# Patient Record
Sex: Male | Born: 1965 | Race: White | Hispanic: Yes | Marital: Single | Smoking: Current every day smoker
Health system: Southern US, Community
[De-identification: ages and names within clinical notes are randomized; demographics above are authoritative.]

## PROBLEM LIST (undated history)

## (undated) DIAGNOSIS — K7460 Unspecified cirrhosis of liver: Secondary | ICD-10-CM

## (undated) DIAGNOSIS — R569 Unspecified convulsions: Secondary | ICD-10-CM

---

## 1998-02-13 ENCOUNTER — Emergency Department (HOSPITAL_COMMUNITY): Admission: EM | Admit: 1998-02-13 | Discharge: 1998-02-13 | Payer: Self-pay | Admitting: Emergency Medicine

## 1998-07-21 ENCOUNTER — Encounter: Payer: Self-pay | Admitting: Emergency Medicine

## 1998-07-22 ENCOUNTER — Encounter: Payer: Self-pay | Admitting: Emergency Medicine

## 1998-07-22 ENCOUNTER — Emergency Department (HOSPITAL_COMMUNITY): Admission: EM | Admit: 1998-07-22 | Discharge: 1998-07-22 | Payer: Self-pay | Admitting: Emergency Medicine

## 1998-09-06 ENCOUNTER — Emergency Department (HOSPITAL_COMMUNITY): Admission: EM | Admit: 1998-09-06 | Discharge: 1998-09-06 | Payer: Self-pay | Admitting: Emergency Medicine

## 1998-09-06 ENCOUNTER — Encounter: Payer: Self-pay | Admitting: Emergency Medicine

## 1999-02-24 ENCOUNTER — Emergency Department (HOSPITAL_COMMUNITY): Admission: EM | Admit: 1999-02-24 | Discharge: 1999-02-24 | Payer: Self-pay | Admitting: *Deleted

## 1999-02-25 ENCOUNTER — Encounter: Payer: Self-pay | Admitting: Emergency Medicine

## 1999-04-13 ENCOUNTER — Emergency Department (HOSPITAL_COMMUNITY): Admission: EM | Admit: 1999-04-13 | Discharge: 1999-04-13 | Payer: Self-pay | Admitting: Emergency Medicine

## 1999-04-13 ENCOUNTER — Encounter: Payer: Self-pay | Admitting: Emergency Medicine

## 1999-06-03 ENCOUNTER — Encounter: Payer: Self-pay | Admitting: Emergency Medicine

## 1999-06-03 ENCOUNTER — Emergency Department (HOSPITAL_COMMUNITY): Admission: EM | Admit: 1999-06-03 | Discharge: 1999-06-04 | Payer: Self-pay | Admitting: Emergency Medicine

## 1999-11-03 ENCOUNTER — Encounter: Payer: Self-pay | Admitting: Emergency Medicine

## 1999-11-03 ENCOUNTER — Emergency Department (HOSPITAL_COMMUNITY): Admission: EM | Admit: 1999-11-03 | Discharge: 1999-11-03 | Payer: Self-pay | Admitting: Emergency Medicine

## 2000-07-30 ENCOUNTER — Emergency Department (HOSPITAL_COMMUNITY): Admission: EM | Admit: 2000-07-30 | Discharge: 2000-07-30 | Payer: Self-pay

## 2000-07-30 ENCOUNTER — Encounter: Payer: Self-pay | Admitting: Emergency Medicine

## 2001-02-20 ENCOUNTER — Emergency Department (HOSPITAL_COMMUNITY): Admission: EM | Admit: 2001-02-20 | Discharge: 2001-02-20 | Payer: Self-pay

## 2001-02-25 ENCOUNTER — Emergency Department (HOSPITAL_COMMUNITY): Admission: EM | Admit: 2001-02-25 | Discharge: 2001-02-25 | Payer: Self-pay | Admitting: Emergency Medicine

## 2001-05-11 ENCOUNTER — Emergency Department (HOSPITAL_COMMUNITY): Admission: EM | Admit: 2001-05-11 | Discharge: 2001-05-12 | Payer: Self-pay | Admitting: Emergency Medicine

## 2001-05-11 ENCOUNTER — Encounter: Payer: Self-pay | Admitting: Emergency Medicine

## 2001-07-09 ENCOUNTER — Encounter: Payer: Self-pay | Admitting: Emergency Medicine

## 2001-07-09 ENCOUNTER — Inpatient Hospital Stay (HOSPITAL_COMMUNITY): Admission: AC | Admit: 2001-07-09 | Discharge: 2001-07-12 | Payer: Self-pay

## 2001-07-11 ENCOUNTER — Encounter: Payer: Self-pay | Admitting: General Surgery

## 2001-07-13 ENCOUNTER — Encounter: Payer: Self-pay | Admitting: Emergency Medicine

## 2001-07-13 ENCOUNTER — Emergency Department (HOSPITAL_COMMUNITY): Admission: EM | Admit: 2001-07-13 | Discharge: 2001-07-14 | Payer: Self-pay | Admitting: Emergency Medicine

## 2001-09-17 ENCOUNTER — Emergency Department (HOSPITAL_COMMUNITY): Admission: EM | Admit: 2001-09-17 | Discharge: 2001-09-17 | Payer: Self-pay

## 2002-05-19 ENCOUNTER — Emergency Department (HOSPITAL_COMMUNITY): Admission: EM | Admit: 2002-05-19 | Discharge: 2002-05-20 | Payer: Self-pay | Admitting: Emergency Medicine

## 2002-05-19 ENCOUNTER — Encounter: Payer: Self-pay | Admitting: Emergency Medicine

## 2002-05-22 ENCOUNTER — Encounter: Payer: Self-pay | Admitting: Emergency Medicine

## 2002-05-22 ENCOUNTER — Emergency Department (HOSPITAL_COMMUNITY): Admission: EM | Admit: 2002-05-22 | Discharge: 2002-05-22 | Payer: Self-pay | Admitting: Emergency Medicine

## 2002-06-04 ENCOUNTER — Emergency Department (HOSPITAL_COMMUNITY): Admission: EM | Admit: 2002-06-04 | Discharge: 2002-06-04 | Payer: Self-pay | Admitting: Emergency Medicine

## 2002-09-07 ENCOUNTER — Emergency Department (HOSPITAL_COMMUNITY): Admission: EM | Admit: 2002-09-07 | Discharge: 2002-09-07 | Payer: Self-pay | Admitting: Emergency Medicine

## 2003-03-10 ENCOUNTER — Emergency Department (HOSPITAL_COMMUNITY): Admission: EM | Admit: 2003-03-10 | Discharge: 2003-03-10 | Payer: Self-pay | Admitting: Emergency Medicine

## 2003-03-10 ENCOUNTER — Encounter: Payer: Self-pay | Admitting: Emergency Medicine

## 2003-04-04 ENCOUNTER — Emergency Department (HOSPITAL_COMMUNITY): Admission: EM | Admit: 2003-04-04 | Discharge: 2003-04-05 | Payer: Self-pay | Admitting: *Deleted

## 2003-04-05 ENCOUNTER — Encounter: Payer: Self-pay | Admitting: Emergency Medicine

## 2003-04-05 ENCOUNTER — Encounter: Payer: Self-pay | Admitting: *Deleted

## 2003-04-09 ENCOUNTER — Encounter: Payer: Self-pay | Admitting: Emergency Medicine

## 2003-04-09 ENCOUNTER — Emergency Department (HOSPITAL_COMMUNITY): Admission: EM | Admit: 2003-04-09 | Discharge: 2003-04-09 | Payer: Self-pay | Admitting: Emergency Medicine

## 2003-11-16 ENCOUNTER — Inpatient Hospital Stay (HOSPITAL_COMMUNITY): Admission: EM | Admit: 2003-11-16 | Discharge: 2003-11-18 | Payer: Self-pay | Admitting: *Deleted

## 2003-12-02 ENCOUNTER — Emergency Department (HOSPITAL_COMMUNITY): Admission: EM | Admit: 2003-12-02 | Discharge: 2003-12-02 | Payer: Self-pay | Admitting: Emergency Medicine

## 2003-12-06 ENCOUNTER — Emergency Department (HOSPITAL_COMMUNITY): Admission: EM | Admit: 2003-12-06 | Discharge: 2003-12-06 | Payer: Self-pay | Admitting: Emergency Medicine

## 2003-12-22 ENCOUNTER — Emergency Department (HOSPITAL_COMMUNITY): Admission: AC | Admit: 2003-12-22 | Discharge: 2003-12-23 | Payer: Self-pay

## 2004-02-03 ENCOUNTER — Emergency Department (HOSPITAL_COMMUNITY): Admission: EM | Admit: 2004-02-03 | Discharge: 2004-02-03 | Payer: Self-pay | Admitting: Emergency Medicine

## 2004-06-22 ENCOUNTER — Emergency Department (HOSPITAL_COMMUNITY): Admission: EM | Admit: 2004-06-22 | Discharge: 2004-06-22 | Payer: Self-pay | Admitting: Emergency Medicine

## 2004-09-27 ENCOUNTER — Emergency Department (HOSPITAL_COMMUNITY): Admission: EM | Admit: 2004-09-27 | Discharge: 2004-09-27 | Payer: Self-pay | Admitting: Emergency Medicine

## 2004-11-30 ENCOUNTER — Emergency Department (HOSPITAL_COMMUNITY): Admission: EM | Admit: 2004-11-30 | Discharge: 2004-11-30 | Payer: Self-pay | Admitting: Emergency Medicine

## 2005-03-12 ENCOUNTER — Emergency Department (HOSPITAL_COMMUNITY): Admission: EM | Admit: 2005-03-12 | Discharge: 2005-03-12 | Payer: Self-pay | Admitting: Emergency Medicine

## 2005-03-13 ENCOUNTER — Ambulatory Visit: Payer: Self-pay | Admitting: *Deleted

## 2005-05-15 ENCOUNTER — Emergency Department (HOSPITAL_COMMUNITY): Admission: EM | Admit: 2005-05-15 | Discharge: 2005-05-15 | Payer: Self-pay | Admitting: Emergency Medicine

## 2005-06-28 ENCOUNTER — Inpatient Hospital Stay (HOSPITAL_COMMUNITY): Admission: EM | Admit: 2005-06-28 | Discharge: 2005-06-29 | Payer: Self-pay | Admitting: Emergency Medicine

## 2005-06-28 ENCOUNTER — Emergency Department (HOSPITAL_COMMUNITY): Admission: EM | Admit: 2005-06-28 | Discharge: 2005-06-28 | Payer: Self-pay | Admitting: Emergency Medicine

## 2005-08-25 ENCOUNTER — Emergency Department (HOSPITAL_COMMUNITY): Admission: AC | Admit: 2005-08-25 | Discharge: 2005-08-26 | Payer: Self-pay

## 2005-12-06 ENCOUNTER — Emergency Department (HOSPITAL_COMMUNITY): Admission: EM | Admit: 2005-12-06 | Discharge: 2005-12-06 | Payer: Self-pay | Admitting: Emergency Medicine

## 2006-02-21 ENCOUNTER — Emergency Department (HOSPITAL_COMMUNITY): Admission: EM | Admit: 2006-02-21 | Discharge: 2006-02-22 | Payer: Self-pay | Admitting: Emergency Medicine

## 2006-03-30 ENCOUNTER — Emergency Department (HOSPITAL_COMMUNITY): Admission: AC | Admit: 2006-03-30 | Discharge: 2006-03-30 | Payer: Self-pay

## 2006-05-24 ENCOUNTER — Inpatient Hospital Stay (HOSPITAL_COMMUNITY): Admission: AC | Admit: 2006-05-24 | Discharge: 2006-06-01 | Payer: Self-pay

## 2007-10-06 ENCOUNTER — Emergency Department (HOSPITAL_COMMUNITY): Admission: EM | Admit: 2007-10-06 | Discharge: 2007-10-07 | Payer: Self-pay | Admitting: Emergency Medicine

## 2007-11-03 ENCOUNTER — Emergency Department (HOSPITAL_COMMUNITY): Admission: EM | Admit: 2007-11-03 | Discharge: 2007-11-03 | Payer: Self-pay | Admitting: Emergency Medicine

## 2007-11-16 ENCOUNTER — Emergency Department (HOSPITAL_COMMUNITY): Admission: EM | Admit: 2007-11-16 | Discharge: 2007-11-16 | Payer: Self-pay | Admitting: Emergency Medicine

## 2007-12-08 ENCOUNTER — Emergency Department (HOSPITAL_COMMUNITY): Admission: EM | Admit: 2007-12-08 | Discharge: 2007-12-09 | Payer: Self-pay

## 2008-01-06 ENCOUNTER — Emergency Department (HOSPITAL_COMMUNITY): Admission: EM | Admit: 2008-01-06 | Discharge: 2008-01-07 | Payer: Self-pay | Admitting: Emergency Medicine

## 2008-03-02 ENCOUNTER — Emergency Department (HOSPITAL_COMMUNITY): Admission: EM | Admit: 2008-03-02 | Discharge: 2008-03-03 | Payer: Self-pay | Admitting: Emergency Medicine

## 2008-11-20 ENCOUNTER — Emergency Department (HOSPITAL_COMMUNITY): Admission: EM | Admit: 2008-11-20 | Discharge: 2008-11-21 | Payer: Self-pay | Admitting: Emergency Medicine

## 2010-04-22 ENCOUNTER — Emergency Department (HOSPITAL_COMMUNITY): Admission: EM | Admit: 2010-04-22 | Discharge: 2010-04-23 | Payer: Self-pay | Admitting: Emergency Medicine

## 2010-09-11 LAB — BASIC METABOLIC PANEL
Calcium: 8.3 mg/dL — ABNORMAL LOW (ref 8.4–10.5)
Chloride: 103 mEq/L (ref 96–112)
GFR calc Af Amer: >60 mL/min (ref >60)
GFR calc non Af Amer: >60 mL/min (ref >60)
Potassium: 3.9 mEq/L (ref 3.5–5.1)
Sodium: 138 mEq/L (ref 135–145)

## 2010-09-11 LAB — DIFFERENTIAL
Basophils Relative: 1 % (ref 0–1)
Lymphocytes Relative: 33 % (ref 12–46)
Monocytes Absolute: 0.3 10*3/uL (ref 0.1–1.0)
Monocytes Relative: 4 % (ref 3–12)
Neutro Abs: 4 10*3/uL (ref 1.7–7.7)
Neutrophils Relative %: 62 % (ref 43–77)

## 2010-09-11 LAB — ETHANOL: Alcohol, Ethyl (B): 353 mg/dL — ABNORMAL HIGH (ref 0–10)

## 2010-09-11 LAB — CBC: MCV: 90 fL (ref 78.0–100.0)

## 2010-10-08 LAB — DIFFERENTIAL
Lymphocytes Relative: 27 % (ref 12–46)
Lymphs Abs: 2 10*3/uL (ref 0.7–4.0)
Neutrophils Relative %: 66 % (ref 43–77)

## 2010-10-08 LAB — URINALYSIS, ROUTINE W REFLEX MICROSCOPIC
Bilirubin Urine: NEGATIVE
Glucose, UA: NEGATIVE mg/dL
Ketones, ur: NEGATIVE mg/dL
Nitrite: NEGATIVE
Protein, ur: NEGATIVE mg/dL
Specific Gravity, Urine: 1.005 (ref 1.005–1.030)

## 2010-10-08 LAB — CBC
Hemoglobin: 14.6 g/dL (ref 13.0–17.0)
MCHC: 33.6 g/dL (ref 30.0–36.0)
MCV: 92.3 fL (ref 78.0–100.0)
RBC: 4.72 MIL/uL (ref 4.22–5.81)
RDW: 14.2 % (ref 11.5–15.5)

## 2010-10-08 LAB — COMPREHENSIVE METABOLIC PANEL
CO2: 30 mEq/L (ref 19–32)
Calcium: 8.7 mg/dL (ref 8.4–10.5)
Creatinine, Ser: 0.65 mg/dL (ref 0.4–1.5)
GFR calc Af Amer: >60 mL/min (ref >60)
GFR calc non Af Amer: >60 mL/min (ref >60)
Glucose, Bld: 107 mg/dL — ABNORMAL HIGH (ref 70–99)
Total Protein: 7.3 g/dL (ref 6.0–8.3)

## 2010-10-08 LAB — RAPID URINE DRUG SCREEN, HOSP PERFORMED
Barbiturates: NOT DETECTED
Tetrahydrocannabinol: NOT DETECTED

## 2010-10-09 ENCOUNTER — Emergency Department (HOSPITAL_COMMUNITY)
Admission: EM | Admit: 2010-10-09 | Discharge: 2010-10-10 | Disposition: A | Discharge status: Home / Self Care | Payer: Self-pay | Attending: Emergency Medicine | Admitting: Emergency Medicine

## 2010-10-09 DIAGNOSIS — R1011 Right upper quadrant pain: Secondary | ICD-10-CM | POA: Insufficient documentation

## 2010-10-09 DIAGNOSIS — F1010 Alcohol abuse, uncomplicated: Secondary | ICD-10-CM | POA: Insufficient documentation

## 2010-10-10 LAB — CBC
HCT: 41.5 % (ref 39.0–52.0)
Hemoglobin: 14.7 g/dL (ref 13.0–17.0)
MCH: 33 pg (ref 26.0–34.0)
MCV: 93 fL (ref 78.0–100.0)
RBC: 4.46 MIL/uL (ref 4.22–5.81)

## 2010-10-10 LAB — COMPREHENSIVE METABOLIC PANEL
Albumin: 4 g/dL (ref 3.5–5.2)
Alkaline Phosphatase: 92 U/L (ref 39–117)
BUN: 10 mg/dL (ref 6–23)
Chloride: 105 mEq/L (ref 96–112)
Glucose, Bld: 98 mg/dL (ref 70–99)
Potassium: 3.8 mEq/L (ref 3.5–5.1)
Total Bilirubin: 0.7 mg/dL (ref 0.3–1.2)

## 2010-10-10 LAB — ETHANOL: Alcohol, Ethyl (B): 288 mg/dL — ABNORMAL HIGH (ref 0–10)

## 2010-10-10 LAB — DIFFERENTIAL
Lymphocytes Relative: 43 % (ref 12–46)
Lymphs Abs: 2.2 10*3/uL (ref 0.7–4.0)
Monocytes Relative: 6 % (ref 3–12)
Neutro Abs: 2.1 10*3/uL (ref 1.7–7.7)
Neutrophils Relative %: 41 % — ABNORMAL LOW (ref 43–77)

## 2010-10-22 ENCOUNTER — Emergency Department (HOSPITAL_COMMUNITY)
Admission: EM | Admit: 2010-10-22 | Discharge: 2010-10-22 | Disposition: A | Discharge status: Home / Self Care | Payer: Self-pay | Attending: Emergency Medicine | Admitting: Emergency Medicine

## 2010-10-22 DIAGNOSIS — F1010 Alcohol abuse, uncomplicated: Secondary | ICD-10-CM | POA: Insufficient documentation

## 2010-10-22 LAB — BASIC METABOLIC PANEL
BUN: 8 mg/dL (ref 6–23)
CO2: 24 mEq/L (ref 19–32)
Chloride: 109 mEq/L (ref 96–112)
Creatinine, Ser: 0.62 mg/dL (ref 0.4–1.5)

## 2010-10-22 LAB — ETHANOL: Alcohol, Ethyl (B): 409 mg/dL (ref 0–10)
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
## 2011-03-25 LAB — URINALYSIS, ROUTINE W REFLEX MICROSCOPIC
Bilirubin Urine: NEGATIVE
Glucose, UA: NEGATIVE
Hgb urine dipstick: NEGATIVE
Ketones, ur: NEGATIVE
Protein, ur: NEGATIVE

## 2011-03-25 LAB — POCT I-STAT, CHEM 8
BUN: 9
Calcium, Ion: 1.11 — ABNORMAL LOW
Chloride: 108
Creatinine, Ser: 0.9

## 2011-03-25 LAB — CBC
HCT: 39.3
Hemoglobin: 13.9
MCV: 91.6
Platelets: 214
RBC: 4.29
WBC: 5

## 2011-03-25 LAB — DIFFERENTIAL
Eosinophils Relative: 1
Lymphocytes Relative: 41
Lymphs Abs: 2
Monocytes Absolute: 0.3
Monocytes Relative: 5

## 2011-03-25 LAB — SALICYLATE LEVEL: Salicylate Lvl: <4

## 2011-03-25 LAB — RAPID URINE DRUG SCREEN, HOSP PERFORMED
Amphetamines: NOT DETECTED
Benzodiazepines: NOT DETECTED
Tetrahydrocannabinol: NOT DETECTED

## 2011-03-25 LAB — ETHANOL
Alcohol, Ethyl (B): 130 — ABNORMAL HIGH
Alcohol, Ethyl (B): 356 — ABNORMAL HIGH

## 2011-03-27 LAB — COMPREHENSIVE METABOLIC PANEL
ALT: 26
Alkaline Phosphatase: 78
CO2: 20
Glucose, Bld: 100 — ABNORMAL HIGH
Potassium: 3.7
Sodium: 139

## 2011-03-27 LAB — CBC
HCT: 38.6 — ABNORMAL LOW
HCT: 38.8 — ABNORMAL LOW
Hemoglobin: 13.1
Hemoglobin: 13.1
MCHC: 33.8
MCHC: 33.9
Platelets: 232
RBC: 4.12 — ABNORMAL LOW
RDW: 14.5

## 2011-03-27 LAB — DIFFERENTIAL
Basophils Absolute: 0.1
Basophils Relative: 1
Eosinophils Absolute: 0.1
Eosinophils Absolute: 0.1
Lymphocytes Relative: 32
Monocytes Relative: 7
Neutro Abs: 3.1
Neutrophils Relative %: 58

## 2011-03-27 LAB — BASIC METABOLIC PANEL
BUN: 5 — ABNORMAL LOW
CO2: 24
Calcium: 8.6
GFR calc non Af Amer: >60
Glucose, Bld: 96

## 2011-03-27 LAB — ETHANOL: Alcohol, Ethyl (B): 85 — ABNORMAL HIGH

## 2011-04-02 LAB — ETHANOL: Alcohol, Ethyl (B): 466

## 2012-02-16 ENCOUNTER — Encounter (HOSPITAL_COMMUNITY): Payer: Self-pay

## 2012-02-16 ENCOUNTER — Emergency Department (HOSPITAL_COMMUNITY): Payer: Self-pay

## 2012-02-16 ENCOUNTER — Emergency Department (HOSPITAL_COMMUNITY)
Admission: EM | Admit: 2012-02-16 | Discharge: 2012-02-16 | Disposition: A | Discharge status: Home / Self Care | Payer: Self-pay | Attending: Emergency Medicine | Admitting: Emergency Medicine

## 2012-02-16 DIAGNOSIS — S20219A Contusion of unspecified front wall of thorax, initial encounter: Secondary | ICD-10-CM | POA: Insufficient documentation

## 2012-02-16 DIAGNOSIS — IMO0002 Reserved for concepts with insufficient information to code with codable children: Secondary | ICD-10-CM

## 2012-02-16 DIAGNOSIS — F17200 Nicotine dependence, unspecified, uncomplicated: Secondary | ICD-10-CM | POA: Insufficient documentation

## 2012-02-16 DIAGNOSIS — X58XXXA Exposure to other specified factors, initial encounter: Secondary | ICD-10-CM | POA: Insufficient documentation

## 2012-02-16 DIAGNOSIS — F1010 Alcohol abuse, uncomplicated: Secondary | ICD-10-CM | POA: Insufficient documentation

## 2012-02-16 DIAGNOSIS — R4182 Altered mental status, unspecified: Secondary | ICD-10-CM | POA: Insufficient documentation

## 2012-02-16 HISTORY — DX: Unspecified convulsions: R56.9

## 2012-02-16 LAB — CBC
HCT: 43.1 % (ref 39.0–52.0)
Hemoglobin: 15.1 g/dL (ref 13.0–17.0)
WBC: 6.5 10*3/uL (ref 4.0–10.5)

## 2012-02-16 LAB — POCT I-STAT, CHEM 8
Chloride: 105 mEq/L (ref 96–112)
Glucose, Bld: 105 mg/dL — ABNORMAL HIGH (ref 70–99)
HCT: 46 % (ref 39.0–52.0)
Potassium: 3.7 mEq/L (ref 3.5–5.1)

## 2012-02-16 LAB — ETHANOL: Alcohol, Ethyl (B): 376 mg/dL — ABNORMAL HIGH (ref 0–11)
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
## 2012-02-16 NOTE — ED Provider Notes (Signed)
Assumed care of pt from Dr Fredderick Phenix. Clinically sober at this time. Will be provided with bus pass. Pt has point tenderness L chest wall consistent with musculoskeletal etiology. Doubt cardiac, infectious, PE, etc. Return precautions discussed. Resource list provided.   Travis Razor, MD 02/16/12 [phone]

## 2012-02-16 NOTE — ED Notes (Signed)
MD at bedside. 
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
## 2012-08-19 ENCOUNTER — Emergency Department (HOSPITAL_COMMUNITY)
Admission: EM | Admit: 2012-08-19 | Discharge: 2012-08-20 | Disposition: A | Discharge status: Home / Self Care | Payer: Self-pay | Attending: Emergency Medicine | Admitting: Emergency Medicine

## 2012-08-19 ENCOUNTER — Encounter (HOSPITAL_COMMUNITY): Payer: Self-pay | Admitting: Emergency Medicine

## 2012-08-19 ENCOUNTER — Emergency Department (HOSPITAL_COMMUNITY): Payer: Self-pay

## 2012-08-19 DIAGNOSIS — K760 Fatty (change of) liver, not elsewhere classified: Secondary | ICD-10-CM

## 2012-08-19 DIAGNOSIS — Z8669 Personal history of other diseases of the nervous system and sense organs: Secondary | ICD-10-CM | POA: Insufficient documentation

## 2012-08-19 DIAGNOSIS — F10929 Alcohol use, unspecified with intoxication, unspecified: Secondary | ICD-10-CM

## 2012-08-19 DIAGNOSIS — F17200 Nicotine dependence, unspecified, uncomplicated: Secondary | ICD-10-CM | POA: Insufficient documentation

## 2012-08-19 DIAGNOSIS — E162 Hypoglycemia, unspecified: Secondary | ICD-10-CM | POA: Insufficient documentation

## 2012-08-19 DIAGNOSIS — F1010 Alcohol abuse, uncomplicated: Secondary | ICD-10-CM | POA: Insufficient documentation

## 2012-08-19 DIAGNOSIS — E7889 Other lipoprotein metabolism disorders: Secondary | ICD-10-CM | POA: Insufficient documentation

## 2012-08-19 LAB — COMPREHENSIVE METABOLIC PANEL
Alkaline Phosphatase: 120 U/L — ABNORMAL HIGH (ref 39–117)
BUN: 8 mg/dL (ref 6–23)
Calcium: 8.1 mg/dL — ABNORMAL LOW (ref 8.4–10.5)
GFR calc Af Amer: >90 mL/min (ref >90)
GFR calc non Af Amer: >90 mL/min (ref >90)
Glucose, Bld: 91 mg/dL (ref 70–99)
Total Protein: 7 g/dL (ref 6.0–8.3)

## 2012-08-19 LAB — URINALYSIS, ROUTINE W REFLEX MICROSCOPIC
Glucose, UA: 250 mg/dL — AB
Hgb urine dipstick: NEGATIVE
Protein, ur: NEGATIVE mg/dL
pH: 5 (ref 5.0–8.0)

## 2012-08-19 LAB — RAPID URINE DRUG SCREEN, HOSP PERFORMED
Amphetamines: NOT DETECTED
Benzodiazepines: NOT DETECTED
Opiates: NOT DETECTED

## 2012-08-19 LAB — CBC WITH DIFFERENTIAL/PLATELET
Eosinophils Absolute: 0.1 10*3/uL (ref 0.0–0.7)
Eosinophils Relative: 1 % (ref 0–5)
HCT: 38.7 % — ABNORMAL LOW (ref 39.0–52.0)
Hemoglobin: 13.5 g/dL (ref 13.0–17.0)
Lymphs Abs: 2.6 10*3/uL (ref 0.7–4.0)
MCH: 31.4 pg (ref 26.0–34.0)
MCV: 90 fL (ref 78.0–100.0)
Monocytes Relative: 7 % (ref 3–12)
RBC: 4.3 MIL/uL (ref 4.22–5.81)

## 2012-08-19 LAB — ETHANOL: Alcohol, Ethyl (B): 281 mg/dL — ABNORMAL HIGH (ref 0–11)

## 2012-08-19 LAB — GLUCOSE, CAPILLARY: Glucose-Capillary: 104 mg/dL — ABNORMAL HIGH (ref 70–99)

## 2012-08-19 MED ORDER — SODIUM CHLORIDE 0.9 % IV SOLN
INTRAVENOUS | Status: DC
  Administered 2012-08-20: via INTRAVENOUS
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
## 2012-08-19 NOTE — ED Notes (Signed)
PA at bedside.
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
## 2012-08-20 ENCOUNTER — Emergency Department (HOSPITAL_COMMUNITY): Payer: Self-pay

## 2012-08-20 LAB — GLUCOSE, CAPILLARY: Glucose-Capillary: 88 mg/dL (ref 70–99)

## 2012-08-20 NOTE — ED Notes (Signed)
Pt ambulated down hallway without incident.  Pt used bathroom without incident.  Pt denies any dizziness.

## 2012-08-20 NOTE — ED Notes (Signed)
Pt denies SI/HI, but placed in blue scrubs for psychiatric evaluation. Pt has been friendly with staff often asking for "sugar" and complimenting staff members. Pt has not been combative. Pt kept attempting to take off second IV, but was compliant once explained purpose of IV.

## 2012-08-20 NOTE — ED Notes (Signed)
Pt transported to US

## 2012-08-20 NOTE — ED Notes (Signed)
Informed us tech pt is ready. Pt is second in line.

## 2012-08-20 NOTE — ED Provider Notes (Signed)
Medical screening examination/treatment/procedure(s) were performed by non-physician practitioner and as supervising physician I was immediately available for consultation/collaboration.  Jones Skene, M.D.     Jones Skene, MD 08/20/12 1050

## 2012-11-24 ENCOUNTER — Emergency Department (HOSPITAL_COMMUNITY)
Admission: EM | Admit: 2012-11-24 | Discharge: 2012-11-25 | Disposition: A | Discharge status: Home / Self Care | Payer: Self-pay | Attending: Emergency Medicine | Admitting: Emergency Medicine

## 2012-11-24 ENCOUNTER — Emergency Department (HOSPITAL_COMMUNITY): Payer: Self-pay

## 2012-11-24 DIAGNOSIS — S0003XA Contusion of scalp, initial encounter: Secondary | ICD-10-CM | POA: Insufficient documentation

## 2012-11-24 DIAGNOSIS — Y9389 Activity, other specified: Secondary | ICD-10-CM | POA: Insufficient documentation

## 2012-11-24 DIAGNOSIS — T07XXXA Unspecified multiple injuries, initial encounter: Secondary | ICD-10-CM

## 2012-11-24 DIAGNOSIS — Z23 Encounter for immunization: Secondary | ICD-10-CM | POA: Insufficient documentation

## 2012-11-24 DIAGNOSIS — IMO0002 Reserved for concepts with insufficient information to code with codable children: Secondary | ICD-10-CM | POA: Insufficient documentation

## 2012-11-24 DIAGNOSIS — Y92410 Unspecified street and highway as the place of occurrence of the external cause: Secondary | ICD-10-CM | POA: Insufficient documentation

## 2012-11-24 DIAGNOSIS — S1093XA Contusion of unspecified part of neck, initial encounter: Secondary | ICD-10-CM | POA: Insufficient documentation

## 2012-11-24 DIAGNOSIS — Z8669 Personal history of other diseases of the nervous system and sense organs: Secondary | ICD-10-CM | POA: Insufficient documentation

## 2012-11-24 LAB — CBC
HCT: 39.4 % (ref 39.0–52.0)
Hemoglobin: 14 g/dL (ref 13.0–17.0)
MCHC: 35.5 g/dL (ref 30.0–36.0)
MCV: 90.4 fL (ref 78.0–100.0)
RDW: 12.8 % (ref 11.5–15.5)
WBC: 5.5 10*3/uL (ref 4.0–10.5)

## 2012-11-24 LAB — POCT I-STAT, CHEM 8
Calcium, Ion: 1.03 mmol/L — ABNORMAL LOW (ref 1.12–1.23)
Glucose, Bld: 104 mg/dL — ABNORMAL HIGH (ref 70–99)
HCT: 41 % (ref 39.0–52.0)
Hemoglobin: 13.9 g/dL (ref 13.0–17.0)
Potassium: 3.4 mEq/L — ABNORMAL LOW (ref 3.5–5.1)
TCO2: 24 mmol/L (ref 0–100)

## 2012-11-24 LAB — COMPREHENSIVE METABOLIC PANEL
ALT: 44 U/L (ref 0–53)
Albumin: 4 g/dL (ref 3.5–5.2)
Alkaline Phosphatase: 119 U/L — ABNORMAL HIGH (ref 39–117)
Calcium: 8.6 mg/dL (ref 8.4–10.5)
Potassium: 3.2 mEq/L — ABNORMAL LOW (ref 3.5–5.1)
Sodium: 140 mEq/L (ref 135–145)
Total Protein: 7.5 g/dL (ref 6.0–8.3)

## 2012-11-24 LAB — SAMPLE TO BLOOD BANK

## 2012-11-24 MED ORDER — TETANUS-DIPHTHERIA TOXOIDS TD 5-2 LFU IM INJ
0.5000 mL | INJECTION | Freq: Once | INTRAMUSCULAR | Status: DC
  Filled 2012-11-24: qty 0.5

## 2012-11-24 NOTE — ED Notes (Signed)
Patient advised we need a urine sample.
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
## 2012-11-25 ENCOUNTER — Emergency Department (HOSPITAL_COMMUNITY): Payer: Self-pay

## 2012-11-25 MED ORDER — IOHEXOL 300 MG/ML  SOLN
100.0000 mL | Freq: Once | INTRAMUSCULAR | Status: AC | PRN
  Administered 2012-11-25: 100 mL via INTRAVENOUS

## 2012-11-25 MED ORDER — NAPROXEN 500 MG PO TABS: 500.0000 mg | ORAL_TABLET | Freq: Two times a day (BID) | ORAL | Status: DC

## 2012-11-25 MED ORDER — TETANUS-DIPHTH-ACELL PERTUSSIS 5-2.5-18.5 LF-MCG/0.5 IM SUSP
0.5000 mL | Freq: Once | INTRAMUSCULAR | Status: AC
  Administered 2012-11-25: 0.5 mL via INTRAMUSCULAR
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
## 2012-11-25 NOTE — ED Notes (Signed)
The patient is AOx4 and comfortable with his discharge instructions. 

## 2012-12-05 ENCOUNTER — Emergency Department (HOSPITAL_COMMUNITY): Payer: Self-pay

## 2012-12-05 ENCOUNTER — Emergency Department (HOSPITAL_COMMUNITY)
Admission: EM | Admit: 2012-12-05 | Discharge: 2012-12-05 | Disposition: A | Discharge status: Home / Self Care | Payer: Self-pay | Attending: Emergency Medicine | Admitting: Emergency Medicine

## 2012-12-05 DIAGNOSIS — F10920 Alcohol use, unspecified with intoxication, uncomplicated: Secondary | ICD-10-CM

## 2012-12-05 DIAGNOSIS — F17200 Nicotine dependence, unspecified, uncomplicated: Secondary | ICD-10-CM | POA: Insufficient documentation

## 2012-12-05 DIAGNOSIS — Z8669 Personal history of other diseases of the nervous system and sense organs: Secondary | ICD-10-CM | POA: Insufficient documentation

## 2012-12-05 DIAGNOSIS — F1010 Alcohol abuse, uncomplicated: Secondary | ICD-10-CM | POA: Insufficient documentation

## 2012-12-05 DIAGNOSIS — S0990XA Unspecified injury of head, initial encounter: Secondary | ICD-10-CM | POA: Insufficient documentation

## 2012-12-05 DIAGNOSIS — S0993XA Unspecified injury of face, initial encounter: Secondary | ICD-10-CM | POA: Insufficient documentation

## 2012-12-05 LAB — CBC
MCH: 32.4 pg (ref 26.0–34.0)
MCHC: 35.7 g/dL (ref 30.0–36.0)
MCV: 90.7 fL (ref 78.0–100.0)
Platelets: 209 10*3/uL (ref 150–400)

## 2012-12-05 LAB — BASIC METABOLIC PANEL
BUN: 8 mg/dL (ref 6–23)
Calcium: 8.3 mg/dL — ABNORMAL LOW (ref 8.4–10.5)
Creatinine, Ser: 0.52 mg/dL (ref 0.50–1.35)
GFR calc Af Amer: >90 mL/min (ref >90)
GFR calc non Af Amer: >90 mL/min (ref >90)
Potassium: 3.5 mEq/L (ref 3.5–5.1)

## 2012-12-05 NOTE — ED Notes (Signed)
Patient up walking.MD Lynelle Doctor in to eval. Walking with a limp stated his leg is hurting.

## 2012-12-05 NOTE — ED Notes (Signed)
Patient requesting food. Stated he will be ready to go after he eats
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
## 2012-12-05 NOTE — ED Notes (Signed)
Bed:WA11<BR> Expected date:<BR> Expected time:<BR> Means of arrival:<BR> Comments:<BR> EMS
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
## 2013-04-22 ENCOUNTER — Encounter (HOSPITAL_COMMUNITY): Payer: Self-pay | Admitting: Certified Registered Nurse Anesthetist

## 2013-04-22 ENCOUNTER — Inpatient Hospital Stay (HOSPITAL_COMMUNITY)
Admission: EM | Admit: 2013-04-22 | Discharge: 2013-04-23 | DRG: 494 | Disposition: A | Discharge status: Home / Self Care | Payer: Self-pay | Attending: Orthopedic Surgery | Admitting: Orthopedic Surgery

## 2013-04-22 ENCOUNTER — Inpatient Hospital Stay (HOSPITAL_COMMUNITY): Payer: No Typology Code available for payment source

## 2013-04-22 ENCOUNTER — Encounter (HOSPITAL_COMMUNITY): Payer: Self-pay | Admitting: Emergency Medicine

## 2013-04-22 ENCOUNTER — Emergency Department (HOSPITAL_COMMUNITY): Payer: Self-pay

## 2013-04-22 ENCOUNTER — Encounter (HOSPITAL_COMMUNITY)
Admission: EM | Disposition: A | Discharge status: Home / Self Care | Payer: Self-pay | Source: Home / Self Care | Attending: Orthopedic Surgery

## 2013-04-22 ENCOUNTER — Inpatient Hospital Stay (HOSPITAL_COMMUNITY): Payer: Self-pay | Admitting: Certified Registered Nurse Anesthetist

## 2013-04-22 DIAGNOSIS — F1010 Alcohol abuse, uncomplicated: Secondary | ICD-10-CM | POA: Diagnosis present

## 2013-04-22 DIAGNOSIS — W172XXA Fall into hole, initial encounter: Secondary | ICD-10-CM | POA: Diagnosis present

## 2013-04-22 DIAGNOSIS — Y929 Unspecified place or not applicable: Secondary | ICD-10-CM

## 2013-04-22 DIAGNOSIS — S8253XA Displaced fracture of medial malleolus of unspecified tibia, initial encounter for closed fracture: Principal | ICD-10-CM | POA: Diagnosis present

## 2013-04-22 DIAGNOSIS — S82853A Displaced trimalleolar fracture of unspecified lower leg, initial encounter for closed fracture: Secondary | ICD-10-CM | POA: Diagnosis present

## 2013-04-22 DIAGNOSIS — S82892A Other fracture of left lower leg, initial encounter for closed fracture: Secondary | ICD-10-CM

## 2013-04-22 DIAGNOSIS — F17200 Nicotine dependence, unspecified, uncomplicated: Secondary | ICD-10-CM | POA: Diagnosis present

## 2013-04-22 DIAGNOSIS — F1210 Cannabis abuse, uncomplicated: Secondary | ICD-10-CM | POA: Diagnosis present

## 2013-04-22 HISTORY — PX: ORIF ANKLE FRACTURE: SHX5408

## 2013-04-22 LAB — CBC
HCT: 42.4 % (ref 39.0–52.0)
Hemoglobin: 15 g/dL (ref 13.0–17.0)
MCH: 31.8 pg (ref 26.0–34.0)
MCHC: 35.4 g/dL (ref 30.0–36.0)
Platelets: 268 10*3/uL (ref 150–400)
RBC: 4.71 MIL/uL (ref 4.22–5.81)

## 2013-04-22 LAB — SURGICAL PCR SCREEN: Staphylococcus aureus: NEGATIVE

## 2013-04-22 LAB — BASIC METABOLIC PANEL
BUN: 8 mg/dL (ref 6–23)
CO2: 25 mEq/L (ref 19–32)
Chloride: 104 mEq/L (ref 96–112)
GFR calc Af Amer: >90 mL/min (ref >90)
GFR calc non Af Amer: >90 mL/min (ref >90)
Glucose, Bld: 103 mg/dL — ABNORMAL HIGH (ref 70–99)
Potassium: 3.7 mEq/L (ref 3.5–5.1)

## 2013-04-22 SURGERY — OPEN REDUCTION INTERNAL FIXATION (ORIF) ANKLE FRACTURE
Anesthesia: General | Site: Ankle | Laterality: Left | Wound class: Clean

## 2013-04-22 MED ORDER — SODIUM CHLORIDE 0.9 % IV BOLUS (SEPSIS)
1000.0000 mL | Freq: Once | INTRAVENOUS | Status: AC
  Administered 2013-04-22: 1000 mL via INTRAVENOUS

## 2013-04-22 MED ORDER — LACTATED RINGERS IV SOLN: INTRAVENOUS | Status: DC

## 2013-04-22 MED ORDER — HYDROMORPHONE HCL PF 1 MG/ML IJ SOLN
1.0000 mg | Freq: Once | INTRAMUSCULAR | Status: AC
  Administered 2013-04-22: 1 mg via INTRAVENOUS
  Filled 2013-04-22: qty 1

## 2013-04-22 MED ORDER — ONDANSETRON HCL 4 MG/2ML IJ SOLN: 4.0000 mg | Freq: Three times a day (TID) | INTRAMUSCULAR | Status: AC | PRN

## 2013-04-22 MED ORDER — CEFAZOLIN SODIUM-DEXTROSE 2-3 GM-% IV SOLR
2.0000 g | Freq: Once | INTRAVENOUS | Status: AC
  Administered 2013-04-22: 2 g via INTRAVENOUS

## 2013-04-22 MED ORDER — HYDROMORPHONE HCL PF 1 MG/ML IJ SOLN: 0.5000 mg | INTRAMUSCULAR | Status: DC | PRN

## 2013-04-22 MED ORDER — DEXAMETHASONE SODIUM PHOSPHATE 10 MG/ML IJ SOLN
INTRAMUSCULAR | Status: DC | PRN
  Administered 2013-04-22: 10 mg via INTRAVENOUS

## 2013-04-22 MED ORDER — 0.9 % SODIUM CHLORIDE (POUR BTL) OPTIME
TOPICAL | Status: DC | PRN
  Administered 2013-04-22: 1000 mL

## 2013-04-22 MED ORDER — ASPIRIN EC 325 MG PO TBEC: 325.0000 mg | DELAYED_RELEASE_TABLET | Freq: Every day | ORAL | Status: DC

## 2013-04-22 MED ORDER — HYDROMORPHONE HCL PF 1 MG/ML IJ SOLN
1.0000 mg | INTRAMUSCULAR | Status: DC | PRN
  Administered 2013-04-22: 1 mg via INTRAVENOUS
  Filled 2013-04-22: qty 1

## 2013-04-22 MED ORDER — LACTATED RINGERS IV SOLN
INTRAVENOUS | Status: DC | PRN
  Administered 2013-04-22: 20:00:00 via INTRAVENOUS

## 2013-04-22 MED ORDER — PROPOFOL 10 MG/ML IV BOLUS
INTRAVENOUS | Status: DC | PRN
  Administered 2013-04-22: 200 mg via INTRAVENOUS

## 2013-04-22 MED ORDER — SODIUM CHLORIDE 0.9 % IV SOLN: INTRAVENOUS | Status: AC

## 2013-04-22 MED ORDER — CEFAZOLIN SODIUM-DEXTROSE 2-3 GM-% IV SOLR
INTRAVENOUS | Status: AC
  Filled 2013-04-22: qty 50

## 2013-04-22 MED ORDER — MIDAZOLAM HCL 5 MG/5ML IJ SOLN
INTRAMUSCULAR | Status: DC | PRN
  Administered 2013-04-22: 2 mg via INTRAVENOUS

## 2013-04-22 MED ORDER — HYDROCODONE-ACETAMINOPHEN 5-325 MG PO TABS
1.0000 | ORAL_TABLET | Freq: Four times a day (QID) | ORAL | Status: DC | PRN
Start: 2013-04-22 — End: 2013-04-23

## 2013-04-22 MED ORDER — ONDANSETRON HCL 4 MG/2ML IJ SOLN
INTRAMUSCULAR | Status: DC | PRN
  Administered 2013-04-22: 4 mg via INTRAVENOUS

## 2013-04-22 MED ORDER — ONDANSETRON HCL 4 MG/2ML IJ SOLN
4.0000 mg | Freq: Once | INTRAMUSCULAR | Status: AC
  Administered 2013-04-22: 4 mg via INTRAVENOUS
  Filled 2013-04-22: qty 2

## 2013-04-22 MED ORDER — HYDROMORPHONE HCL PF 1 MG/ML IJ SOLN
INTRAMUSCULAR | Status: DC | PRN
  Administered 2013-04-22 (×2): 1 mg via INTRAVENOUS

## 2013-04-22 MED ORDER — FENTANYL CITRATE 0.05 MG/ML IJ SOLN
INTRAMUSCULAR | Status: DC | PRN
  Administered 2013-04-22 (×2): 50 ug via INTRAVENOUS

## 2013-04-22 MED ORDER — HYDROMORPHONE HCL PF 1 MG/ML IJ SOLN: 0.2500 mg | INTRAMUSCULAR | Status: DC | PRN

## 2013-04-22 SURGICAL SUPPLY — 55 items
BAG ZIPLOCK 12X15 (MISCELLANEOUS) IMPLANT
BANDAGE ELASTIC 4 VELCRO ST LF (GAUZE/BANDAGES/DRESSINGS) ×4 IMPLANT
BANDAGE ELASTIC 6 VELCRO ST LF (GAUZE/BANDAGES/DRESSINGS) ×2 IMPLANT
BIT DRILL 2.5X2.75 QC CALB (BIT) ×2 IMPLANT
BIT DRILL 2.9 CANN QC NONSTRL (BIT) ×2 IMPLANT
BIT DRILL 3.5X5.5 QC CALB (BIT) ×2 IMPLANT
CLOTH BEACON ORANGE TIMEOUT ST (SAFETY) ×2 IMPLANT
CUFF TOURN SGL QUICK 34 (TOURNIQUET CUFF) ×1
CUFF TRNQT CYL 34X4X40X1 (TOURNIQUET CUFF) ×1 IMPLANT
DRAPE C-ARM 42X120 X-RAY (DRAPES) ×2 IMPLANT
DRAPE U-SHAPE 47X51 STRL (DRAPES) ×2 IMPLANT
DRSG ADAPTIC 3X8 NADH LF (GAUZE/BANDAGES/DRESSINGS) IMPLANT
DRSG PAD ABDOMINAL 8X10 ST (GAUZE/BANDAGES/DRESSINGS) ×2 IMPLANT
DURAPREP 26ML APPLICATOR (WOUND CARE) ×2 IMPLANT
ELECT REM PT RETURN 9FT ADLT (ELECTROSURGICAL) ×2
ELECTRODE REM PT RTRN 9FT ADLT (ELECTROSURGICAL) ×1 IMPLANT
GAUZE XEROFORM 5X9 LF (GAUZE/BANDAGES/DRESSINGS) ×2 IMPLANT
GLOVE BIOGEL PI IND STRL 7.5 (GLOVE) ×1 IMPLANT
GLOVE BIOGEL PI IND STRL 8 (GLOVE) IMPLANT
GLOVE BIOGEL PI INDICATOR 7.5 (GLOVE) ×1
GLOVE BIOGEL PI INDICATOR 8 (GLOVE)
GLOVE ECLIPSE 8.0 STRL XLNG CF (GLOVE) IMPLANT
GLOVE ORTHO TXT STRL SZ7.5 (GLOVE) ×4 IMPLANT
GLOVE SURG ORTHO 8.0 STRL STRW (GLOVE) IMPLANT
GOWN BRE IMP PREV XXLGXLNG (GOWN DISPOSABLE) ×2 IMPLANT
GOWN PREVENTION PLUS LG XLONG (DISPOSABLE) ×2 IMPLANT
K-WIRE ACE 1.6X6 (WIRE) ×2
KWIRE ACE 1.6X6 (WIRE) ×1 IMPLANT
MANIFOLD NEPTUNE II (INSTRUMENTS) ×2 IMPLANT
NS IRRIG 1000ML POUR BTL (IV SOLUTION) ×2 IMPLANT
PACK LOWER EXTREMITY WL (CUSTOM PROCEDURE TRAY) ×2 IMPLANT
PAD CAST 4YDX4 CTTN HI CHSV (CAST SUPPLIES) ×1 IMPLANT
PADDING CAST COTTON 4X4 STRL (CAST SUPPLIES) ×2
PADDING CAST COTTON 6X4 STRL (CAST SUPPLIES) ×2 IMPLANT
PLATE ACE 100DEG 6HOLE (Plate) ×2 IMPLANT
POSITIONER SURGICAL ARM (MISCELLANEOUS) ×2 IMPLANT
SCREW CANC LAG 4X14 (Screw) ×4 IMPLANT
SCREW CANC LAG 4X45 (Screw) ×4 IMPLANT
SCREW CORTICAL 3.5MM  12MM (Screw) ×1 IMPLANT
SCREW CORTICAL 3.5MM 12MM (Screw) ×1 IMPLANT
SCREW CORTICAL 3.5MM 14MM (Screw) ×4 IMPLANT
SCREW NLOCK CANC HEX 4X22 (Screw) ×1 IMPLANT
SCREW NLOCK CANC HEX 4X22 PT (Screw) ×1 IMPLANT
SPLINT PLASTER CAST XFAST 5X30 (CAST SUPPLIES) ×1 IMPLANT
SPLINT PLASTER XFAST SET 5X30 (CAST SUPPLIES) ×1
SPONGE GAUZE 4X4 12PLY (GAUZE/BANDAGES/DRESSINGS) ×2 IMPLANT
STRIP CLOSURE SKIN 1/2X4 (GAUZE/BANDAGES/DRESSINGS) IMPLANT
SUT ETHILON 2 0 PS N (SUTURE) ×6 IMPLANT
SUT ETHILON 3 0 PS 1 (SUTURE) IMPLANT
SUT MNCRL AB 4-0 PS2 18 (SUTURE) IMPLANT
SUT VIC AB 1 CT1 27 (SUTURE) ×2
SUT VIC AB 1 CT1 27XBRD ANTBC (SUTURE) ×2 IMPLANT
SUT VIC AB 2-0 CT1 27 (SUTURE) ×2
SUT VIC AB 2-0 CT1 TAPERPNT 27 (SUTURE) ×2 IMPLANT
TOWEL OR 17X26 10 PK STRL BLUE (TOWEL DISPOSABLE) ×4 IMPLANT

## 2013-04-22 NOTE — Preoperative (Signed)
Beta Blockers   Reason not to administer Beta Blockers:Not Applicable 
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
## 2013-04-22 NOTE — Plan of Care (Signed)
Problem: Consults Goal: General Medical Patient Education See Patient Education Module for specific education. Fractured left ankle
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
## 2013-04-22 NOTE — Discharge Instructions (Signed)
Keep splint clean and dry, DO NOT GET IT WET  Non weight bearing left lower extremity until further directed

## 2013-04-22 NOTE — Anesthesia Preprocedure Evaluation (Addendum)
Anesthesia Evaluation  Patient identified by MRN, date of birth, ID band Patient awake    Reviewed: Allergy & Precautions, H&P , NPO status , Patient's Chart, lab work & pertinent test results  Airway Mallampati: II TM Distance: >3 FB Neck ROM: full    Dental  (+) Missing and Dental Advisory Given All upper front missing:   Pulmonary neg pulmonary ROS,  breath sounds clear to auscultation  Pulmonary exam normal       Cardiovascular Exercise Tolerance: Good negative cardio ROS  Rhythm:regular Rate:Normal  LAFB   Neuro/Psych Seizures -, Well Controlled,  negative neurological ROS  negative psych ROS   GI/Hepatic negative GI ROS, Neg liver ROS,   Endo/Other  negative endocrine ROS  Renal/GU negative Renal ROS  negative genitourinary   Musculoskeletal   Abdominal   Peds  Hematology negative hematology ROS (+)   Anesthesia Other Findings   Reproductive/Obstetrics negative OB ROS                          Anesthesia Physical Anesthesia Plan  ASA: II  Anesthesia Plan: General   Post-op Pain Management:    Induction: Intravenous  Airway Management Planned: LMA  Additional Equipment:   Intra-op Plan:   Post-operative Plan:   Informed Consent: I have reviewed the patients History and Physical, chart, labs and discussed the procedure including the risks, benefits and alternatives for the proposed anesthesia with the patient or authorized representative who has indicated his/her understanding and acceptance.   Dental Advisory Given  Plan Discussed with: CRNA and Surgeon  Anesthesia Plan Comments:         Anesthesia Quick Evaluation

## 2013-04-22 NOTE — Brief Op Note (Signed)
04/22/2013  10:23 PM  PATIENT:  Travis Fry  47 y.o. male  PRE-OPERATIVE DIAGNOSIS:  Closed displaced trimalleolar ankle fracture with joint subluxation  POST-OPERATIVE DIAGNOSIS:  Closed displaced trimalleolar ankle fracture with joint subluxation  PROCEDURE:  Procedure(s): OPEN REDUCTION INTERNAL FIXATION (ORIF) ANKLE FRACTURE (Left) Biomet small fragment set  SURGEON:  Surgeon(s) and Role:    * Shelda Pal, MD - Primary  PHYSICIAN ASSISTANT: None  ANESTHESIA:   general  EBL:  Total I/O In: 600 [I.V.:600] Out: 100 [Urine:100]  BLOOD ADMINISTERED:none  DRAINS: none   LOCAL MEDICATIONS USED:  NONE  SPECIMEN:  No Specimen  DISPOSITION OF SPECIMEN:  N/A  COUNTS:  YES  TOURNIQUET:   Total Tourniquet Time Documented: Thigh (Left) - 70 minutes Total: Thigh (Left) - 70 minutes   DICTATION: .Other Dictation: Dictation Number [id number]  PLAN OF CARE: Admit to inpatient   PATIENT DISPOSITION:  PACU - hemodynamically stable.   Delay start of Pharmacological VTE agent (>24hrs) due to surgical blood loss or risk of bleeding: no

## 2013-04-22 NOTE — Transfer of Care (Signed)
Immediate Anesthesia Transfer of Care Note  Patient: Travis Fry  Procedure(s) Performed: Procedure(s): OPEN REDUCTION INTERNAL FIXATION (ORIF) ANKLE FRACTURE (Left)  Patient Location: PACU  Anesthesia Type:General  Level of Consciousness: sedated and patient cooperative  Airway & Oxygen Therapy: Patient Spontanous Breathing and Patient connected to face mask oxygen  Post-op Assessment: Report given to PACU RN and Post -op Vital signs reviewed and stable  Post vital signs: Reviewed and stable  Complications: No apparent anesthesia complications

## 2013-04-22 NOTE — ED Provider Notes (Addendum)
CSN: [id number]     Arrival date & time 04/22/13  0932 History   First MD Initiated Contact with Patient 04/22/13 [phone]     Chief Complaint  Patient presents with  . Ankle Pain   (Consider location/radiation/quality/duration/timing/severity/associated sxs/prior Treatment) Patient is a 47 y.o. male presenting with ankle pain. The history is provided by the patient and the EMS personnel.  Ankle Pain Associated symptoms: no back pain, no fever and no neck pain   pt states last pm stepped into hole while walking, twisting left ankle. C/o dull, moderate, constant, non radiating, left ankle pain since. Skin intact. No numbness/weakness. No knee or hip pain. Denies any other injury. No head injury or headache. No loc. No neck or back pain.  Is able to, but hurts to walk.      Past Medical History  Diagnosis Date  . Seizures     pt is poor historian but states thhat he has hx seizures   History reviewed. No pertinent past surgical history. No family history on file. History  Substance Use Topics  . Smoking status: Current Every Day Smoker    Types: Cigarettes  . Smokeless tobacco: Not on file  . Alcohol Use: Yes    Review of Systems  Constitutional: Negative for fever.  Musculoskeletal: Negative for back pain and neck pain.  Skin: Negative for wound.  Neurological: Negative for weakness and numbness.    Allergies  Review of patient's allergies indicates no known allergies.  Home Medications  No current outpatient prescriptions on file. BP 105/66  Pulse 83  Temp(Src) 98.7 F (37.1 C) (Oral)  Resp 14  Ht 5\' 5"  (1.651 m)  Wt 145 lb (65.772 kg)  BMI 24.13 kg/m2  SpO2 97% Physical Exam  Nursing note and vitals reviewed. Constitutional: He is oriented to person, place, and time. He appears well-developed and well-nourished. No distress.  HENT:  Head: Atraumatic.  Eyes: Pupils are equal, round, and reactive to light.  Neck: Neck supple. No tracheal deviation present.   Cardiovascular: Normal rate and intact distal pulses.   Pulmonary/Chest: Effort normal. No accessory muscle usage. No respiratory distress. He exhibits no tenderness.  Abdominal: Soft. He exhibits no distension. There is no tenderness.  Musculoskeletal: Normal range of motion.  sts and tenderness med and lat malleolus left ankle. Ankle grossly stable. Distal pulses palp. No 5th mt or foot tenderness. No prox tib fib or knee tenderness. CTLS spine, non tender, aligned, no step off.   Neurological: He is alert and oriented to person, place, and time.  Motor/sens intact left foot, and bil ext.   Skin: Skin is warm and dry.  Psychiatric: He has a normal mood and affect.    ED Course  Procedures (including critical care time)  Results for orders placed during the hospital encounter of 04/22/13  CBC      Result Value Range   WBC 8.1  4.0 - 10.5 K/uL   RBC 4.71  4.22 - 5.81 MIL/uL   Hemoglobin 15.0  13.0 - 17.0 g/dL   HCT 46.9  62.9 - 52.8 %   MCV 90.0  78.0 - 100.0 fL   MCH 31.8  26.0 - 34.0 pg   MCHC 35.4  30.0 - 36.0 g/dL   RDW 41.3  24.4 - 01.0 %   Platelets 268  150 - 400 K/uL   Dg Tibia/fibula Left  04/22/2013   CLINICAL DATA:  Ankle fracture post close reduction.  EXAM: LEFT TIBIA AND FIBULA - 2  VIEW  COMPARISON:  Ankle radiographs same date.  FINDINGS: 1111 hr. A lateral splint has been applied. In the AP projection, there has been no significant change in the alignment of the fractures of the distal fibula and medial malleolus. On the lateral view, the fibular fracture demonstrates slightly increased posterior displacement, now measuring 9 mm. There is stable widening of the tibiotalar joint with lateral subluxation of the talus with respect to the tibial plafond. No tarsal bone fractures are identified.  IMPRESSION: Little change in overall alignment. The fibular fracture demonstrates slightly increased posterior displacement on the lateral view.   Electronically Signed   By: Roxy Horseman M.D.   On: 04/22/2013 11:43   Dg Ankle Complete Left  04/22/2013   CLINICAL DATA:  Ankle pain and swelling after injury.  EXAM: LEFT ANKLE COMPLETE - 3+ VIEW  COMPARISON:  None.  FINDINGS: Oblique fracture lateral malleolus and transverse fracture of the medial malleolus. Additional fracture of medial malleolar tip. Tibia and fibular shaft displaced 10 mm medially with respect to the talus. Possible posterior malleolar fracture. Surrounding soft tissue swelling noted.  IMPRESSION: 1. Trimalleolar ankle fracture, supination-external rotation pattern, with 1 cm of medial displacement of the tibia and fibular shaft with respect to the talus.   Electronically Signed   By: Herbie Baltimore M.D.   On: 04/22/2013 10:24     EKG Interpretation   None       MDM  Xr.  Elevate. Ice.   Confirmed nkda w pt.  vicodin po.  Reviewed nursing notes and prior charts for additional history.   Discussed pt with Dr Charlann Boxer, including trimalleolar fracture.  No significant change in alignment post splinting.  Also discussed social challenges w Dr Charlann Boxer given pts living situation - he will review films and get back with [place].    Pain improved w meds. Distal pulses palp.   Dr Charlann Boxer indicates temp orders, elevate ankle, ice. Npo.       Suzi Roots, MD 04/22/13 [phone]

## 2013-04-22 NOTE — Progress Notes (Signed)
[facility] provided pt with a list of primary care resources, highlighting the [facility].  °

## 2013-04-23 MED ORDER — ASPIRIN EC 325 MG PO TBEC
325.0000 mg | DELAYED_RELEASE_TABLET | Freq: Two times a day (BID) | ORAL | Status: DC
  Administered 2013-04-23: 325 mg via ORAL
  Filled 2013-04-23 (×3): qty 1

## 2013-04-23 MED ORDER — CEFAZOLIN SODIUM 1-5 GM-% IV SOLN
1.0000 g | Freq: Four times a day (QID) | INTRAVENOUS | Status: DC
  Administered 2013-04-23 (×2): 1 g via INTRAVENOUS
  Filled 2013-04-23 (×3): qty 50

## 2013-04-23 MED ORDER — DSS 100 MG PO CAPS: 100.0000 mg | ORAL_CAPSULE | Freq: Two times a day (BID) | ORAL | Status: DC

## 2013-04-23 MED ORDER — LORAZEPAM 1 MG PO TABS: 1.0000 mg | ORAL_TABLET | Freq: Four times a day (QID) | ORAL | Status: DC | PRN

## 2013-04-23 MED ORDER — DOCUSATE SODIUM 100 MG PO CAPS
100.0000 mg | ORAL_CAPSULE | Freq: Two times a day (BID) | ORAL | Status: DC
  Administered 2013-04-23: 100 mg via ORAL

## 2013-04-23 MED ORDER — METOCLOPRAMIDE HCL 5 MG/ML IJ SOLN: 5.0000 mg | Freq: Three times a day (TID) | INTRAMUSCULAR | Status: DC | PRN

## 2013-04-23 MED ORDER — METOCLOPRAMIDE HCL 10 MG PO TABS: 5.0000 mg | ORAL_TABLET | Freq: Three times a day (TID) | ORAL | Status: DC | PRN

## 2013-04-23 MED ORDER — ASPIRIN 325 MG PO TBEC: 325.0000 mg | DELAYED_RELEASE_TABLET | Freq: Two times a day (BID) | ORAL | Status: DC

## 2013-04-23 MED ORDER — HYDROCODONE-ACETAMINOPHEN 5-325 MG PO TABS: 1.0000 | ORAL_TABLET | Freq: Four times a day (QID) | ORAL | Status: DC | PRN

## 2013-04-23 MED ORDER — ONDANSETRON HCL 4 MG PO TABS: 4.0000 mg | ORAL_TABLET | Freq: Four times a day (QID) | ORAL | Status: DC | PRN

## 2013-04-23 MED ORDER — SODIUM CHLORIDE 0.9 % IV SOLN
INTRAVENOUS | Status: DC
  Administered 2013-04-23: 02:00:00 via INTRAVENOUS

## 2013-04-23 MED ORDER — HYDROMORPHONE HCL PF 1 MG/ML IJ SOLN: 0.5000 mg | INTRAMUSCULAR | Status: DC | PRN

## 2013-04-23 MED ORDER — ONDANSETRON HCL 4 MG/2ML IJ SOLN: 4.0000 mg | Freq: Four times a day (QID) | INTRAMUSCULAR | Status: DC | PRN

## 2013-04-23 MED ORDER — HYDROCODONE-ACETAMINOPHEN 5-325 MG PO TABS
1.0000 | ORAL_TABLET | ORAL | Status: DC | PRN
  Administered 2013-04-23 (×2): 1 via ORAL
  Administered 2013-04-23 (×2): 2 via ORAL
  Filled 2013-04-23: qty 2
  Filled 2013-04-23 (×2): qty 1
  Filled 2013-04-23: qty 2

## 2013-04-23 NOTE — Anesthesia Postprocedure Evaluation (Signed)
  Anesthesia Post-op Note  Patient: Travis Fry  Procedure(s) Performed: Procedure(s) (LRB): OPEN REDUCTION INTERNAL FIXATION (ORIF) ANKLE FRACTURE (Left)  Patient Location: PACU  Anesthesia Type: General  Level of Consciousness: awake and alert   Airway and Oxygen Therapy: Patient Spontanous Breathing  Post-op Pain: mild  Post-op Assessment: Post-op Vital signs reviewed, Patient's Cardiovascular Status Stable, Respiratory Function Stable, Patent Airway and No signs of Nausea or vomiting  Last Vitals:  Filed Vitals:   04/23/13 0613  BP: 109/70  Pulse: 80  Temp: 36.6 C  Resp: 15    Post-op Vital Signs: stable   Complications: No apparent anesthesia complications

## 2013-04-23 NOTE — Progress Notes (Signed)
04/23/2013 1655 No NCM needs identified. Isidoro Donning RN CCM Case Mgmt phone [phone]

## 2013-04-23 NOTE — Progress Notes (Signed)
Patient ID: Travis Fry, male   DOB: 02-May-1966, 47 y.o.   MRN: [id number] Subjective: 1 Day Post-Op Procedure(s) (LRB): OPEN REDUCTION INTERNAL FIXATION (ORIF) ANKLE FRACTURE (Left)    Patient reports pain as mild.  Did well overnight, no events.  No problems with Etoh withdrawal, no mental status changes  Objective:   VITALS:   Filed Vitals:   04/23/13 0613  BP: 109/70  Pulse: 80  Temp: 97.8 F (36.6 C)  Resp: 15    Neurovascular intact Incision: dressing C/D/I (splint)  LABS  Recent Labs  04/22/13 1045  HGB 15.0  HCT 42.4  WBC 8.1  PLT 268     Recent Labs  04/22/13 1129  NA 142  K 3.7  BUN 8  CREATININE 0.67  GLUCOSE 103*    No results found for this basename: LABPT, INR,  in the last 72 hours   Assessment/Plan: 1 Day Post-Op Procedure(s) (LRB): OPEN REDUCTION INTERNAL FIXATION (ORIF) ANKLE FRACTURE (Left)   Up with therapy - non weight bearing left lower extremity for 6 weeks Discharge home once therapy has reviewed  Home with crutches or walker Home with Cam walker boot that he will transition to once out of splint

## 2013-04-23 NOTE — Evaluation (Signed)
Physical Therapy Evaluation Patient Details Name: Travis Fry MRN: [id number] DOB: [birth year] Today's Date: 04/23/2013 Time: 3086-5784 PT Time Calculation (min): 12 min  PT Assessment / Plan / Recommendation History of Present Illness  47 year old male who has a fairly rough social situation, but no real medical comorbidities other than alcohol abuse and use.  He stepped in a hole last night with immediate deformity, inability to bear weight.  He was then brought to the emergency room where radiographs revealed a trimalleolar ankle fracture with joint subluxation.  Clinical Impression  Patient is s/p L ankle ORIF surgery resulting in functional limitations due to the deficits listed below (see PT Problem List).  Patient will benefit from skilled PT to increase their independence and safety with mobility to allow discharge to the venue listed below.  Pt plans to d/c home with sister and will need RW upon d/c.  Attempted using crutches however pt unable to stand without assist or safely using crutches.  Pt educated to occasionally wiggle toes, keep L LE elevated, apply ice for pain upon return home, and maintain no weight on L LE upon return home.  Will follow if remains in acute.     PT Assessment  Patient needs continued PT services    Follow Up Recommendations  No PT follow up (f/u per surgeon)    Does the patient have the potential to tolerate intense rehabilitation      Barriers to Discharge        Equipment Recommendations  Rolling walker with 5" wheels    Recommendations for Other Services     Frequency Min 5X/week    Precautions / Restrictions Precautions Precautions: Fall Restrictions Weight Bearing Restrictions: Yes LLE Weight Bearing: Non weight bearing   Pertinent Vitals/Pain 8/10 throbbing pain L LE, elevated with pillows, pt aware to call for RN if pain persists or needs meds     Mobility  Bed Mobility Bed Mobility: Supine to Sit Supine to Sit: 6: Modified  independent (Device/Increase time) Transfers Transfers: Sit to Stand;Stand to Sit Sit to Stand: 4: Min assist;4: Min guard;With upper extremity assist;From bed Stand to Sit: 4: Min guard;4: Min assist;With upper extremity assist;To bed;To chair/3-in-1 Details for Transfer Assistance: verbal cues for safe technique and NWB L LE, attempted safe crutch standing technique however pt unable to stand safely so educated on transfer with RW instead, and pt min/guard with RW Ambulation/Gait Ambulation/Gait Assistance: 4: Min guard Ambulation Distance (Feet): 40 Feet Assistive device: Rolling walker Ambulation/Gait Assistance Details: verbal cues for safe technique, pt did well maintaing NWB after initial cue to keep foot off floor, fatigued quickly and pain increased which limited distance Gait velocity: decreased    Exercises     PT Diagnosis: Difficulty walking;Acute pain  PT Problem List: Decreased mobility;Decreased knowledge of use of DME;Pain PT Treatment Interventions: DME instruction;Gait training;Functional mobility training;Therapeutic activities;Therapeutic exercise;Patient/family education     PT Goals(Current goals can be found in the care plan section) Acute Rehab PT Goals PT Goal Formulation: With patient Time For Goal Achievement: 04/30/13 Potential to Achieve Goals: Good  Visit Information  Last PT Received On: 04/23/13 Assistance Needed: +1 History of Present Illness: 47 year old male who has a fairly rough social situation, but no real medical comorbidities other than alcohol abuse and use.  He stepped in a hole last night with immediate deformity, inability to bear weight.  He was then brought to the emergency room where radiographs revealed a trimalleolar ankle fracture with joint subluxation.  Prior Functioning  Home Living Family/patient expects to be discharged to:: Private residence Living Arrangements: Other relatives (going to sister's home) Type of Home:  House Home Access: Level entry Home Layout: One level Home Equipment: None Prior Function Level of Independence: Independent Communication Communication: No difficulties    Cognition  Cognition Arousal/Alertness: Awake/alert Behavior During Therapy: Flat affect Overall Cognitive Status: Within Functional Limits for tasks assessed    Extremity/Trunk Assessment Lower Extremity Assessment Lower Extremity Assessment: Overall WFL for tasks assessed (except L lower leg NT in splint/bandging, able to slightly wiggle toes)   Balance    End of Session PT - End of Session Equipment Utilized During Treatment: Gait belt Activity Tolerance: Patient limited by fatigue;Patient limited by pain Patient left: in chair;with call bell/phone within reach Nurse Communication:  (equipment needs)  GP     Treana Lacour,KATHrine E 04/23/2013, 11:10 AM Zenovia Jarred, PT, DPT 04/23/2013 Pager: [phone]

## 2013-04-24 NOTE — Anesthesia Postprocedure Evaluation (Deleted)
  Anesthesia Post-op Note  Patient: Travis Fry  Procedure(s) Performed: Procedure(s) (LRB): OPEN REDUCTION INTERNAL FIXATION (ORIF) ANKLE FRACTURE (Left)  Patient Location: PACU  Anesthesia Type: General  Level of Consciousness: awake and alert   Airway and Oxygen Therapy: Patient Spontanous Breathing  Post-op Pain: mild  Post-op Assessment: Post-op Vital signs reviewed, Patient's Cardiovascular Status Stable, Respiratory Function Stable, Patent Airway and No signs of Nausea or vomiting  Last Vitals:  Filed Vitals:   04/23/13 0613  BP: 109/70  Pulse: 80  Temp: 36.6 C  Resp: 15    Post-op Vital Signs: stable   Complications: No apparent anesthesia complications 

## 2013-04-25 ENCOUNTER — Encounter (HOSPITAL_COMMUNITY): Payer: Self-pay | Admitting: Orthopedic Surgery

## 2013-05-01 NOTE — Discharge Summary (Signed)
Physician Discharge Summary  Patient ID: Travis Fry MRN: [id number] DOB/AGE: [birth year] 47 y.o.  Admit date: 04/22/2013 Discharge date: 04/23/2013   Procedures:  Procedure(s) (LRB): OPEN REDUCTION INTERNAL FIXATION (ORIF) ANKLE FRACTURE (Left)  Attending Physician:  Dr. Durene Romans   Admission Diagnoses:   Left ankle fracture   Discharge Diagnoses:  Active Problems:   * No active hospital problems. *  Past Medical History  Diagnosis Date  . Seizures     pt is poor historian but states thhat he has hx seizures    HPI: Pt is a 47 y.o. male stepped in a hole last night and presented to the ER with pain deformity of ankle. X-rays of left ankle revealed displaced unstable ankle fracture. He was splinted in the ER and admitted to my service as he reported has no home. I was worried about fracture care, post-operative arrangements etc  PCP: No primary provider on file.   Discharged Condition: good  Hospital Course:  Patient underwent the above stated procedure on 04/22/2013. Patient tolerated the procedure well and brought to the recovery room in good condition and subsequently to the floor.  POD #1 BP: 109/70 ; Pulse: 80 ; Temp: 97.8 F (36.6 C) ; Resp: 15 Patient reports pain as mild. Did well overnight, no events. No problems with Etoh withdrawal, no mental status changes. Neurovascular intact and incision: dressing C/D/I (splint).  LABS  Basename    HGB  15.0  HCT  42.4    Discharge Exam: General appearance: alert, cooperative and no distress Extremities: Homans sign is negative, no sign of DVT, no edema, redness or tenderness in the calves or thighs and no ulcers, gangrene or trophic changes  Disposition:   Home or Self Care with follow up in 2 weeks   Follow-up Information   Follow up with Shelda Pal, MD In 2 weeks.   Specialty:  Orthopedic Surgery   Contact information:   [address] [place] [address] [phone]        Discharge Orders   Future Orders Complete By Expires   Call MD / Call 911  As directed    Comments:     If you experience chest pain or shortness of breath, CALL 911 and be transported to the hospital emergency room.  If you develope a fever above 101 F, pus (white drainage) or increased drainage or redness at the wound, or calf pain, call your surgeon's office.   Constipation Prevention  As directed    Comments:     Drink plenty of fluids.  Prune juice may be helpful.  You may use a stool softener, such as Colace (over the counter) 100 mg twice a day.  Use MiraLax (over the counter) for constipation as needed.   Discharge instructions  As directed    Comments:     As noted other places  NO WEIGHT ON LEFT FOOT FOR 6 weeks RETURN TO THE OFFICE IN 2 WEEKS TO HAVE WOUND CHECKED AND SUTURES TAKEN OUT  KEEP SPLINT DRY - CALL OFFICE IF IT GETS WET   Increase activity slowly as tolerated  As directed         Medication List         aspirin EC 325 MG tablet  Take 1 tablet (325 mg total) by mouth daily.     aspirin 325 MG EC tablet  Take 1 tablet (325 mg total) by mouth 2 (two) times daily after a meal.  DSS 100 MG Caps  Take 100 mg by mouth 2 (two) times daily.     HYDROcodone-acetaminophen 5-325 MG per tablet  Commonly known as:  NORCO  Take 1-2 tablets by mouth every 6 (six) hours as needed for pain.         Signed: Anastasio Auerbach. Emonnie Cannady   PAC  05/01/2013, 9:54 PM

## 2013-06-03 ENCOUNTER — Ambulatory Visit: Payer: Self-pay

## 2013-06-27 ENCOUNTER — Ambulatory Visit: Payer: Self-pay | Admitting: Internal Medicine

## 2013-08-09 ENCOUNTER — Emergency Department (HOSPITAL_COMMUNITY)
Admission: EM | Admit: 2013-08-09 | Discharge: 2013-08-09 | Disposition: A | Discharge status: Home / Self Care | Payer: Self-pay | Attending: Emergency Medicine | Admitting: Emergency Medicine

## 2013-08-09 ENCOUNTER — Emergency Department (HOSPITAL_COMMUNITY): Payer: Self-pay

## 2013-08-09 ENCOUNTER — Encounter (HOSPITAL_COMMUNITY): Payer: Self-pay | Admitting: Emergency Medicine

## 2013-08-09 DIAGNOSIS — Z87891 Personal history of nicotine dependence: Secondary | ICD-10-CM | POA: Insufficient documentation

## 2013-08-09 DIAGNOSIS — Z8669 Personal history of other diseases of the nervous system and sense organs: Secondary | ICD-10-CM | POA: Insufficient documentation

## 2013-08-09 DIAGNOSIS — F10229 Alcohol dependence with intoxication, unspecified: Secondary | ICD-10-CM | POA: Insufficient documentation

## 2013-08-09 DIAGNOSIS — R109 Unspecified abdominal pain: Secondary | ICD-10-CM

## 2013-08-09 DIAGNOSIS — R1032 Left lower quadrant pain: Secondary | ICD-10-CM | POA: Insufficient documentation

## 2013-08-09 DIAGNOSIS — Z8719 Personal history of other diseases of the digestive system: Secondary | ICD-10-CM | POA: Insufficient documentation

## 2013-08-09 DIAGNOSIS — F10929 Alcohol use, unspecified with intoxication, unspecified: Secondary | ICD-10-CM

## 2013-08-09 DIAGNOSIS — Z79899 Other long term (current) drug therapy: Secondary | ICD-10-CM | POA: Insufficient documentation

## 2013-08-09 DIAGNOSIS — Z7982 Long term (current) use of aspirin: Secondary | ICD-10-CM | POA: Insufficient documentation

## 2013-08-09 HISTORY — DX: Unspecified cirrhosis of liver: K74.60

## 2013-08-09 LAB — POCT I-STAT, CHEM 8
BUN: 10 mg/dL (ref 6–23)
Calcium, Ion: 1.09 mmol/L — ABNORMAL LOW (ref 1.12–1.23)
Chloride: 102 mEq/L (ref 96–112)
Creatinine, Ser: 1.2 mg/dL (ref 0.50–1.35)
Glucose, Bld: 97 mg/dL (ref 70–99)
HEMATOCRIT: 46 % (ref 39.0–52.0)
HEMOGLOBIN: 15.6 g/dL (ref 13.0–17.0)
POTASSIUM: 3.6 meq/L — AB (ref 3.7–5.3)
Sodium: 142 mEq/L (ref 137–147)
TCO2: 26 mmol/L (ref 0–100)

## 2013-08-09 LAB — CBC
HCT: 39 % (ref 39.0–52.0)
Hemoglobin: 13.7 g/dL (ref 13.0–17.0)
MCH: 32 pg (ref 26.0–34.0)
MCHC: 35.1 g/dL (ref 30.0–36.0)
MCV: 91.1 fL (ref 78.0–100.0)
PLATELETS: 297 10*3/uL (ref 150–400)
RBC: 4.28 MIL/uL (ref 4.22–5.81)
RDW: 13.2 % (ref 11.5–15.5)
WBC: 6.6 10*3/uL (ref 4.0–10.5)

## 2013-08-09 LAB — CBC WITH DIFFERENTIAL/PLATELET
BASOS ABS: 0.1 10*3/uL (ref 0.0–0.1)
Basophils Relative: 1 % (ref 0–1)
Eosinophils Absolute: 0.1 10*3/uL (ref 0.0–0.7)
Eosinophils Relative: 2 % (ref 0–5)
HCT: 42.2 % (ref 39.0–52.0)
Hemoglobin: 14.7 g/dL (ref 13.0–17.0)
Lymphocytes Relative: 29 % (ref 12–46)
Lymphs Abs: 2.2 10*3/uL (ref 0.7–4.0)
MCH: 31.6 pg (ref 26.0–34.0)
MCHC: 34.8 g/dL (ref 30.0–36.0)
MCV: 90.8 fL (ref 78.0–100.0)
Monocytes Absolute: 0.5 10*3/uL (ref 0.1–1.0)
Monocytes Relative: 7 % (ref 3–12)
NEUTROS ABS: 4.7 10*3/uL (ref 1.7–7.7)
NEUTROS PCT: 62 % (ref 43–77)
Platelets: 335 10*3/uL (ref 150–400)
RBC: 4.65 MIL/uL (ref 4.22–5.81)
RDW: 13.2 % (ref 11.5–15.5)
WBC: 7.5 10*3/uL (ref 4.0–10.5)

## 2013-08-09 LAB — COMPREHENSIVE METABOLIC PANEL
ALT: 17 U/L (ref 0–53)
AST: 22 U/L (ref 0–37)
Albumin: 3.6 g/dL (ref 3.5–5.2)
Alkaline Phosphatase: 135 U/L — ABNORMAL HIGH (ref 39–117)
BUN: 10 mg/dL (ref 6–23)
CALCIUM: 8.3 mg/dL — AB (ref 8.4–10.5)
CHLORIDE: 106 meq/L (ref 96–112)
CO2: 25 meq/L (ref 19–32)
Creatinine, Ser: 0.69 mg/dL (ref 0.50–1.35)
GFR calc Af Amer: >90 mL/min (ref >90)
Glucose, Bld: 91 mg/dL (ref 70–99)
Potassium: 3.9 mEq/L (ref 3.7–5.3)
Sodium: 145 mEq/L (ref 137–147)
Total Bilirubin: 0.2 mg/dL — ABNORMAL LOW (ref 0.3–1.2)
Total Protein: 7.3 g/dL (ref 6.0–8.3)

## 2013-08-09 LAB — LIPASE, BLOOD: Lipase: 22 U/L (ref 11–59)

## 2013-08-09 LAB — URINALYSIS, ROUTINE W REFLEX MICROSCOPIC
Bilirubin Urine: NEGATIVE
Glucose, UA: NEGATIVE mg/dL
Hgb urine dipstick: NEGATIVE
Ketones, ur: NEGATIVE mg/dL
Leukocytes, UA: NEGATIVE
NITRITE: NEGATIVE
Protein, ur: NEGATIVE mg/dL
SPECIFIC GRAVITY, URINE: 1.01 (ref 1.005–1.030)
UROBILINOGEN UA: 0.2 mg/dL (ref 0.0–1.0)
pH: 5.5 (ref 5.0–8.0)

## 2013-08-09 MED ORDER — SODIUM CHLORIDE 0.9 % IV BOLUS (SEPSIS)
1000.0000 mL | Freq: Once | INTRAVENOUS | Status: AC
  Administered 2013-08-09: 1000 mL via INTRAVENOUS

## 2013-08-09 MED ORDER — IOHEXOL 300 MG/ML  SOLN
80.0000 mL | Freq: Once | INTRAMUSCULAR | Status: AC | PRN
  Administered 2013-08-09: 80 mL via INTRAVENOUS

## 2013-08-09 NOTE — ED Notes (Signed)
Travis CharsBarbara Paul (sister) [phone]

## 2013-08-09 NOTE — ED Notes (Signed)
MD at bedside. 

## 2013-08-09 NOTE — Discharge Instructions (Signed)
Intoxicacin alcohlica (Alcohol Intoxication) La intoxicacin alcohlica se produce cuando la cantidad de alcohol que se ha consumido daa la capacidad de funcionamiento mental y fsico. El alcohol deteriora directamente la actividad qumica normal del cerebro. Beber grandes cantidades de alcohol puede conducir a Insurance underwriter funcionamiento mental y en el comportamiento, y puede causar muchos efectos fsicos que pueden ser perjudiciales.  La intoxicacin alcohlica puede variar en gravedad desde leve hasta muy grave. Hay varios factores que pueden afectar el nivel de intoxicacin que se produce, como la edad de la persona, el sexo, el peso, la frecuencia de consumo de alcohol, y la presencia de otras enfermedades mdicas (como diabetes, convulsiones o enfermedades del corazn). Los niveles peligrosos de intoxicacin por alcohol pueden ocurrir [employer] personas beben grandes cantidades de alcohol en un corto periodo de tiempo ([place]). El alcohol tambin puede ser especialmente peligroso cuando se combina con ciertos medicamentos recetados o drogas "recreativas". SIGNOS Y SNTOMAS Algunos de los signos y sntomas comunes de intoxicacin leve por alcohol incluyen:  Prdida de la coordinacin.  Cambios en el estado de nimo y la conducta.  Incapacidad para razonar.  Hablar arrastrando las palabras. A medida que la intoxicacin por alcohol avanza a niveles ms graves, Lucianne Lei a Arts administrator otros signos y sntomas. Estos pueden ser:  Vmitos.  Confusin y alteracin de Sales promotion account executive.  Disminucin de Secretary/administrator.  Convulsiones.  Prdida de la conciencia. DIAGNSTICO  El mdico le har una historia clnica y un examen fsico. Se le preguntar acerca de la cantidad y el tipo de alcohol que ha consumido. Se le realizarn anlisis de sangre para medir la concentracin de alcohol en sangre. En muchos lugares, el nivel de alcohol en la sangre debe ser inferior a 80 mg / dL (0,08%) para  poder conducir legalmente. Sin embargo, hay muchos efectos peligrosos del alcohol que pueden ocurrir con niveles mucho ms bajos.  [place] con intoxicacin por alcohol a menudo no requieren Clinical research associate. La mayor parte de los efectos de la intoxicacin por alcohol son temporales, y desaparecen a medida que el alcohol abandona el cuerpo de forma natural. El profesional controlar su estado hasta que est lo suficientemente estable como para volver a casa. A veces se administran lquidos por va intravenosa para ayudar a evitar la deshidratacin.  INSTRUCCIONES PARA EL CUIDADO EN EL HOGAR  No conduzca vehculos despus de beber alcohol.  Mantngase hidratado. Beba gran cantidad de lquido para mantener la orina de tono claro o color amarillo plido. Evite la cafena.   Tome slo medicamentos de venta libre o recetados, segn las indicaciones del mdico.  SOLICITE ATENCIN MDICA SI:   Tiene vmitos persistentes.   No mejora luego de [employer].  Se intoxica con alcohol con frecuencia. El mdico podr ayudarlo a decidir si debe consultar a un terapeuta especializado en el abuso de sustancias. SOLICITE ATENCIN MDICA DE INMEDIATO SI:   Se siente vacilante o tembloroso cuando trata de abandonar el hbito.   Comienza a temblar de manera incontrolable (convulsiones).   Vomita sangre. Puede ser sangre de color rojo brillante o similar al sedimento del caf negro.   Lollie Marrow en la materia fecal. Puede ser de color rojo brillante o de aspecto alquitranado, con olor ftido.   Se siente mareado o se desmaya.  ASEGRESE DE QUE:   Comprende estas instrucciones.  Controlar su afeccin.  Recibir ayuda de inmediato si no mejora o si empeora. Document Released: 06/16/2005 Document Revised: 02/16/2013 ExitCare Patient Information  2014 [place], [place].   Emergency Department Resource Guide 1) Find a Doctor and Pay Out of Pocket Although you won't have to find  out who is covered by your insurance plan, it is a good idea to ask around and get recommendations. You will then need to call the office and see if the doctor you have chosen will accept you as a new patient and what types of options they offer for patients who are self-pay. Some doctors offer discounts or will set up payment plans for their patients who do not have insurance, but you will need to ask so you aren't surprised when you get to your appointment.  2) Contact Your Local Health Department Not all health departments have doctors that can see patients for sick visits, but many do, so it is worth a call to see if yours does. If you don't know where your local health department is, you can check in your phone book. The CDC also has a tool to help you locate your state's health department, and many state websites also have listings of all of their local health departments.  3) Find a Walk-in Clinic If your illness is not likely to be very severe or complicated, you may want to try a walk in clinic. These are popping up all over the country in pharmacies, drugstores, and shopping centers. They're usually staffed by nurse practitioners or physician assistants that have been trained to treat common illnesses and complaints. They're usually fairly quick and inexpensive. However, if you have serious medical issues or chronic medical problems, these are probably not your best option.  No Primary Care Doctor: - Call Health Connect at  [phone] - they can help you locate a primary care doctor that  accepts your insurance, provides certain services, etc. - Physician Referral Service- [phone]  Chronic Pain Problems: Organization         Address  Phone   Notes  [facility]  [phone] Patients need to be referred by their primary care doctor.   Medication Assistance: Organization         Address  Phone   Notes  [facility] Medication [facility] [address]  [place] [place], [place] [address] [phone] --Must be a resident of [facility] -- Must have NO insurance coverage whatsoever (no Medicaid/ Medicare, etc.) -- The pt. MUST have a primary care doctor that directs their care regularly and follows them in the community   Med[facility]ist  [phone]   [employer]  [phone]    Agencies that provide inexpensive medical care: Organization         Address  Phone   Notes  [facility]  [phone]   [facility]    [phone]   [facility] [address] [place], [place] [address] [phone]   [facility] [address] [place] [place] [phone]   Planned Parenthood    [phone]   [facility]    [phone]   [facility]  [address], [place] Phone:  [phone], Fax:  [phone] Hours of Operation:  9 am - 6 pm, M-F.  Also accepts Medicaid/Medicare and self-pay.  [facility]  [address], [place] Phone: [phone], Fax: [phone]. Hours of Operation:  8:30 am - 5:30 pm, M-F.  Also accepts Medicaid and  self-pay.  [facility] [place] [address], [place] Phone: [phone]) [phone]   [facility] [address], [place], [place] [phone], Ext. [phone] Mondays & Thursdays: 7-9 AM.  First 15 patients are seen on a first come, first serve basis.    Medicaid-accepting [facility] Providers:  Organization         Address  Phone   Notes  [facility] [address], [place] [phone] Also accepts self-pay patients.  [facility] [address], [place]  [phone]   [facility] [address], [place] [phone]) [phone]   [facility] [address] [place] [address], [place] [phone](336) [phone]   Renaye RakersVeita Bland  [address], [place]   [phone] Only accepts [place] Access [place] patients after they have their name applied to their card.   Self-Pay (no insurance) in [facility]:  Organization         Address  Phone   Notes  Sickle Cell Patients, [facility] [address], [place] [phone]   [facility] [address], [place] [phone]   [facility]  [address], [place] [phone]) [phone]   [facility]/Dr. Osei-Bonsu  [address] [place] [address], [place] or [phone], [place] [phone]) [phone] Phone number for both [place] and [place] locations is the same.  [facility] [address], [place] [phone]   [facility]  [address] [place] [address], [place] or [address] [phone]   [facility] [address], [place] [phone], phone; [phone](336) [phone], fax Sees patients 1st and 3rd Saturday of every month.  Must not qualify for public or private insurance (i.e. Medicaid, Medicare, [place] Health Choice, Veterans' Benefits)  Household income should be no more than 200% of the poverty level The clinic cannot treat you if you are pregnant or think you are pregnant  Sexually transmitted diseases are not treated at the clinic.    Dental Care: Organization         Address  Phone  Notes  [facility] of [facility] [address], [place] [phone] Accepts children up to age 49 who are enrolled in [place] or [place] Health Choice; pregnant women with a Medicaid card; and children who have applied for Medicaid or [place] Health Choice, but were declined, whose parents can pay a reduced fee at time of service.  [facility] Department of [facility] [place]  [address], [place] [phone] Accepts children up to age 49  who are enrolled in [place] or [place] Health Choice; pregnant women with a Medicaid card; and children who have applied for Medicaid or [place] Health Choice, but were declined, whose parents can pay a reduced fee at time of service.  [facility]  [address], [place] [phone] Patients are seen by appointment only. Walk-ins are not accepted. [facility] will see patients 49 years of age and older. Monday - Tuesday (8am-5pm) Most Wednesdays (8:30-5pm) $30 per visit, cash only  [facility]  [address], [facility] [place] [phone] Patients are seen by appointment only. Walk-ins are not accepted. [facility] will see patients 5318  years of age and older. One Wednesday Evening (Monthly: Volunteer Based).  $30 per visit, cash only  [employer] of [employer]  [phone] for adults; Children under age 41, call Graduate Pediatric Dentistry at [phone]. Children aged 75-14, please call [phone] to request a pediatric application.  Dental services are provided in all areas of dental care including fillings, crowns and bridges, complete and partial dentures, implants, gum treatment, root canals, and extractions. Preventive care is also provided. Treatment is provided to both adults and children. Patients are selected via a lottery and there is often a waiting list.   [facility] [address] [place]  [phone] [email or web address]   [facility] [address] [place], [place] [phone], Ext. [phone] Second and Fourth Thursday of each month, opens at 6:30 AM; Clinic ends at 9 AM.  Patients are seen on a first-come first-served basis, and a limited number are seen during each clinic.   [facility]  [address] Ether Griffins [place], [place] [phone]   Eligibility Requirements You must have lived in [place], [place], or [place] counties for at least the last three months.    You cannot be eligible for state or federal sponsored [place], including [employer], [place], or [employer].   You generally cannot be eligible for healthcare insurance through your employer.    How to apply: Eligibility screenings are held every Tuesday and Wednesday afternoon from 1:00 pm until 4:00 pm. You do not need an appointment for the interview!  [facility] [address], [place], [place] [phone]   [facility] Health Department  [phone]   [facility] Health Department  [phone]   [facility] Health Department  [phone]    Behavioral Health Resources in the Community: Intensive Outpatient Programs Organization         Address  Phone  Notes  [facility] [address] [place], [place] [phone]   [facility] Outpatient [address] [place], [place] [phone]   [facility] [address] [place], [place]  [phone]   [facility] [address]  [place], [place] [phone] or [phone]   Substance Abuse Resources Organization         Address  Phone  Notes  [facility] Drug Services  [phone]   [facility]  [phone]   [facility] [place]  [phone]   Floydene Flock  [phone]   Residential & Outpatient Substance Abuse Program  [phone]   Psychological Services Organization         Address  Phone  Notes  [facility] Behavioral Health  [phone]   [facility]  [phone]   [facility] Mental Health [address] [place] [phone] or [phone]    Mobile Crisis Teams Organization         Address  Phone  Notes  [facility], Mobile Crisis Care Unit  [phone]   [facility]  [address] [place], [place] [phone]   Doristine Locks [address] [place] [phone]    Self-Help/Support  Groups Organization         Address  Phone             Notes  [facility] [place] - variety of support groups  [phone]- I7437963 Call for more information  Narcotics Anonymous (NA), [facility] [address] [place]  2 meetings at this location   Residential Treatment Programs Organization         Address  Phone  Notes  [facility] [address],    [place]  [phone]   [facility]  [address], [place] [address], [place], [place] [phone]   [facility] [address] [place], [place] [phone] Admissions: 8am-3pm M-F  [facility] [address],    [place], [place] [phone]   [facility] [address] [place], [place], [place] [phone]   [facility] [address]  [place], [place] [phone]   [facility] - Intensive Outpatient [address], [place], [place] [phone]   [facility] ([facility] [address] [place].,  [place], [place] [phone] or [phone]   [facility] ([facility]) [address], [place], [place] [phone] Accepts Medicaid  [facility]ellowship Jeff [address]  [place] [phone] Substance Abuse/Addiction Treatment   [facility] Organization         Address  Phone  Notes  [facility]  [phone]   Angie Fava, PhD [address] Ervin Knack [place], [place]   [phone] or [phone]   [facility] Behavioral   [address] [place], [place] [phone]   [facility] [address] [place], [place] [phone] Insurance/Medicaid/sponsorship through [facility] [address]                                    [place], [place] [phone] Therapy/tele-psych/case  [facility] [address], [place] [phone]    Dr. Lolly Mustache  [phone]   [facility]  [employer] [facility] Dept. 1) [address], [place] 2) [address] [place] 3)  [address], [place] [phone]  [phone]   [facility] Child Abuse Hotline 518-864-7345 or [phone] (After Hours)

## 2013-08-09 NOTE — ED Notes (Signed)
Pt. Continues to flex arm muscle when blood pressure cuff inflates. Instructed to relax arm for more accurate readings.

## 2013-08-09 NOTE — ED Notes (Signed)
Pt arrives ambulatory via EMS. Pt c/o rt sided pain. Pt has hx liver cirrhosis. Family states that pt has been drinking all day. CBG 88. VSS.

## 2013-08-09 NOTE — ED Notes (Signed)
Pt and brother advised need bus pass. Advised SW on phone will assist pt when finished.

## 2013-08-09 NOTE — ED Provider Notes (Signed)
CSN: [id number]     Arrival date & time 08/09/13  0410 History   First MD Initiated Contact with Patient 08/09/13 [phone]     Chief Complaint  Patient presents with  . Flank Pain     (Consider location/radiation/quality/duration/timing/severity/associated sxs/prior Treatment) Patient is a 48 y.o. male presenting with flank pain. The history is provided by the patient. The history is limited by the condition of the patient (mildly intoxicated).  Flank Pain This is a new problem. The current episode started yesterday. The problem occurs constantly. The problem has not changed since onset.Associated symptoms include abdominal pain. Pertinent negatives include no chest pain, no headaches and no shortness of breath. Nothing aggravates the symptoms. Nothing relieves the symptoms.    Past Medical History  Diagnosis Date  . Seizures     pt is poor historian but states thhat he has hx seizures  . Cirrhosis of liver    Past Surgical History  Procedure Laterality Date  . Orif ankle fracture Left 04/22/2013    Procedure: OPEN REDUCTION INTERNAL FIXATION (ORIF) ANKLE FRACTURE;  Surgeon: Shelda Pal, MD;  Location: [facility];  Service: Orthopedics;  Laterality: Left;   History reviewed. No pertinent family history. History  Substance Use Topics  . Smoking status: Former Smoker    Types: Cigarettes    Quit date: 04/23/2011  . Smokeless tobacco: Never Used  . Alcohol Use: Yes    Review of Systems  Constitutional: Negative for fever.  Respiratory: Negative for shortness of breath.   Cardiovascular: Negative for chest pain.  Gastrointestinal: Positive for abdominal pain. Negative for nausea, vomiting and diarrhea.  Genitourinary: Positive for flank pain.  Neurological: Negative for headaches.  All other systems reviewed and are negative.      Allergies  Review of patient's allergies indicates no known allergies.  Home Medications   Current Outpatient Rx  Name  Route  Sig  Dispense   Refill  . aspirin EC 325 MG EC tablet   Oral   Take 1 tablet (325 mg total) by mouth 2 (two) times daily after a meal.   60 tablet   0   . aspirin EC 325 MG tablet   Oral   Take 1 tablet (325 mg total) by mouth daily.   30 tablet   0   . docusate sodium 100 MG CAPS   Oral   Take 100 mg by mouth 2 (two) times daily.   60 capsule   0     To help prevent constipation   . HYDROcodone-acetaminophen (NORCO) 5-325 MG per tablet   Oral   Take 1-2 tablets by mouth every 6 (six) hours as needed for pain.   90 tablet   0    BP 97/65  Pulse 74  Temp(Src) 97.6 F (36.4 C) (Oral)  Resp 16  SpO2 99% Physical Exam  Nursing note and vitals reviewed. Constitutional: He is oriented to person, place, and time. He appears well-developed and well-nourished. No distress.  Mildly intoxicated  HENT:  Head: Normocephalic and atraumatic.  Mouth/Throat: No oropharyngeal exudate.  Eyes: EOM are normal. Pupils are equal, round, and reactive to light.  Neck: Normal range of motion. Neck supple.  Cardiovascular: Normal rate and regular rhythm.  Exam reveals no friction rub.   No murmur heard. Pulmonary/Chest: Effort normal and breath sounds normal. No respiratory distress. He has no wheezes. He has no rales.  Abdominal: He exhibits no distension and no mass. There is tenderness (R flank, RLQ). There is  no rebound.  Musculoskeletal: Normal range of motion. He exhibits no edema.  Neurological: He is alert and oriented to person, place, and time.  Skin: Skin is warm. No rash noted. He is not diaphoretic.    ED Course  Procedures (including critical care time) Labs Review Labs Reviewed  COMPREHENSIVE METABOLIC PANEL - Abnormal; Notable for the following:    Calcium 8.3 (*)    Alkaline Phosphatase 135 (*)    Total Bilirubin 0.2 (*)    All other components within normal limits  POCT I-STAT, CHEM 8 - Abnormal; Notable for the following:    Potassium 3.6 (*)    Calcium, Ion 1.09 (*)    All  other components within normal limits  CBC WITH DIFFERENTIAL  URINALYSIS, ROUTINE W REFLEX MICROSCOPIC  CBC  LIPASE, BLOOD   Imaging Review Ct Abdomen Pelvis W Contrast  08/09/2013   CLINICAL DATA:  Right-sided abdominal pain and nausea. Possible cirrhosis.  EXAM: CT ABDOMEN AND PELVIS WITH CONTRAST  TECHNIQUE: Multidetector CT imaging of the abdomen and pelvis was performed using the standard protocol following bolus administration of intravenous contrast.  CONTRAST:  80mL OMNIPAQUE IOHEXOL 300 MG/ML  SOLN  COMPARISON:  CT abdomen and pelvis 11/25/2012.  FINDINGS: Lung Bases: 2.6 x 2.7 cm well circumscribed low-attenuation lesion associated with the distal third of the esophagus just above the gastroesophageal junction has a benign appearance, similar to prior study 11/25/2012 (as well as more remote prior study from 12/22/2003), presumably a foregut duplication cyst.  Abdomen/Pelvis: The appearance of the liver, gallbladder, pancreas, bilateral adrenal glands and bilateral kidneys is unremarkable. 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 Multiple tiny calcifications within the spleen are compatible with calcified granulomas. No significant volume of ascites. No pneumoperitoneum. No pathologic distention of small bowel. No definite lymphadenopathy identified within the abdomen or pelvis. Normal appendix. Prostate gland and urinary bladder are unremarkable in appearance.  Musculoskeletal: There are no aggressive appearing lytic or blastic lesions noted in the visualized portions of the skeleton.  IMPRESSION: 1. No acute abnormality in the abdomen or pelvis to account for the patient's symptoms. 2. Normal appendix. 3. 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 This is similar to remote prior study from 12/22/2003. 4. 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   Electronically Signed   By: Trudie Reedaniel  Entrikin M.D.   On: 08/09/2013 08:21    EKG Interpretation   None       MDM   Final diagnoses:  Abdominal pain  Alcohol intoxication    79M presents with abdominal pain. Hx of cirrhosis, chronic alcoholism. Patient intoxicated on arrival, sleeping comfortably. Softer BPs while sleeping. States chronic abdominal pain, but worsening over past day. Upon awakening, states R sided pain. On exam, mild RUQ tenderness. Moderate RLQ tenderness. Still mildly intoxicated, giving unreliable history. Will check labs, CT scan.  CT normal. Labs normal. Pressures remaining stable, his low BP was likely due to sleeping. Nurse reported he was constantly messing with his BP cuff. Ambulating well. Stable for discharge. BP in the low 100s, sitting up comfortably.  BPs still trending lower. No dizziness, ambulating well. Given fluids with improvement. Stable for discharge. Tolerating PO well.   Dagmar HaitWilliam Cobain Morici, MD 08/09/13 [phone]

## 2013-08-09 NOTE — ED Notes (Signed)
Pt.s clothing removed and placed in belonging bags. Environment secured.

## 2013-08-20 ENCOUNTER — Encounter (HOSPITAL_COMMUNITY): Payer: Self-pay | Admitting: Emergency Medicine

## 2013-08-20 ENCOUNTER — Emergency Department (HOSPITAL_COMMUNITY)
Admission: EM | Admit: 2013-08-20 | Discharge: 2013-08-21 | Disposition: A | Discharge status: Home / Self Care | Payer: Self-pay | Attending: Emergency Medicine | Admitting: Emergency Medicine

## 2013-08-20 DIAGNOSIS — R079 Chest pain, unspecified: Secondary | ICD-10-CM | POA: Insufficient documentation

## 2013-08-20 DIAGNOSIS — R1084 Generalized abdominal pain: Secondary | ICD-10-CM | POA: Insufficient documentation

## 2013-08-20 DIAGNOSIS — Z8669 Personal history of other diseases of the nervous system and sense organs: Secondary | ICD-10-CM | POA: Insufficient documentation

## 2013-08-20 DIAGNOSIS — G8918 Other acute postprocedural pain: Secondary | ICD-10-CM | POA: Insufficient documentation

## 2013-08-20 DIAGNOSIS — Z87891 Personal history of nicotine dependence: Secondary | ICD-10-CM | POA: Insufficient documentation

## 2013-08-20 DIAGNOSIS — F10929 Alcohol use, unspecified with intoxication, unspecified: Secondary | ICD-10-CM

## 2013-08-20 DIAGNOSIS — M25579 Pain in unspecified ankle and joints of unspecified foot: Secondary | ICD-10-CM | POA: Insufficient documentation

## 2013-08-20 DIAGNOSIS — Z8719 Personal history of other diseases of the digestive system: Secondary | ICD-10-CM | POA: Insufficient documentation

## 2013-08-20 DIAGNOSIS — F1010 Alcohol abuse, uncomplicated: Secondary | ICD-10-CM | POA: Insufficient documentation

## 2013-08-20 NOTE — ED Notes (Signed)
Pt transported from post office with c/o L leg pain, pt has had recent leg surgery. Pt is extremely intoxicated

## 2013-08-20 NOTE — ED Notes (Signed)
On assessment patient trying to get out the bed. Unable to talk in full sentences.

## 2013-08-21 ENCOUNTER — Emergency Department (HOSPITAL_COMMUNITY): Payer: Self-pay

## 2013-08-21 LAB — COMPREHENSIVE METABOLIC PANEL
ALK PHOS: 151 U/L — AB (ref 39–117)
ALT: 19 U/L (ref 0–53)
AST: 24 U/L (ref 0–37)
Albumin: 4.1 g/dL (ref 3.5–5.2)
BUN: 9 mg/dL (ref 6–23)
CALCIUM: 8.7 mg/dL (ref 8.4–10.5)
CO2: 23 meq/L (ref 19–32)
Chloride: 101 mEq/L (ref 96–112)
Creatinine, Ser: 0.72 mg/dL (ref 0.50–1.35)
GFR calc non Af Amer: >90 mL/min (ref >90)
GLUCOSE: 104 mg/dL — AB (ref 70–99)
Potassium: 4.2 mEq/L (ref 3.7–5.3)
SODIUM: 141 meq/L (ref 137–147)
Total Protein: 8.2 g/dL (ref 6.0–8.3)

## 2013-08-21 LAB — ETHANOL: Alcohol, Ethyl (B): 337 mg/dL — ABNORMAL HIGH (ref 0–11)

## 2013-08-21 LAB — CBC WITH DIFFERENTIAL/PLATELET
BASOS PCT: 1 % (ref 0–1)
Basophils Absolute: 0 10*3/uL (ref 0.0–0.1)
EOS ABS: 0.1 10*3/uL (ref 0.0–0.7)
Eosinophils Relative: 1 % (ref 0–5)
HCT: 43.8 % (ref 39.0–52.0)
Hemoglobin: 14.8 g/dL (ref 13.0–17.0)
Lymphocytes Relative: 45 % (ref 12–46)
Lymphs Abs: 3 10*3/uL (ref 0.7–4.0)
MCH: 30.1 pg (ref 26.0–34.0)
MCHC: 33.8 g/dL (ref 30.0–36.0)
MCV: 89.2 fL (ref 78.0–100.0)
Monocytes Absolute: 0.4 10*3/uL (ref 0.1–1.0)
Monocytes Relative: 5 % (ref 3–12)
Neutro Abs: 3.2 10*3/uL (ref 1.7–7.7)
Neutrophils Relative %: 48 % (ref 43–77)
PLATELETS: 337 10*3/uL (ref 150–400)
RBC: 4.91 MIL/uL (ref 4.22–5.81)
RDW: 13.1 % (ref 11.5–15.5)
WBC: 6.6 10*3/uL (ref 4.0–10.5)

## 2013-08-21 LAB — LIPASE, BLOOD: Lipase: 26 U/L (ref 11–59)

## 2013-08-21 MED ORDER — NAPROXEN 500 MG PO TABS: 500.0000 mg | ORAL_TABLET | Freq: Two times a day (BID) | ORAL | Status: DC

## 2013-08-21 NOTE — ED Notes (Signed)
Pt woke up and ask for ask water, this was provided by this Clinical research associatewriter

## 2013-08-21 NOTE — Discharge Instructions (Signed)
Take naprosyn for pain, see your family doctor for ongoing pain control.  Please call your doctor for a followup appointment within 24-48 hours. When you talk to your doctor please let them know that you were seen in the emergency department and have them acquire all of your records so that they can discuss the findings with you and formulate a treatment plan to fully care for your new and ongoing problems.   Emergency Department Resource Guide 1) Find a Doctor and Pay Out of Pocket Although you won't have to find out who is covered by your insurance plan, it is a good idea to ask around and get recommendations. You will then need to call the office and see if the doctor you have chosen will accept you as a new patient and what types of options they offer for patients who are self-pay. Some doctors offer discounts or will set up payment plans for their patients who do not have insurance, but you will need to ask so you aren't surprised when you get to your appointment.  2) Contact Your Local Health Department Not all health departments have doctors that can see patients for sick visits, but many do, so it is worth a call to see if yours does. If you don't know where your local health department is, you can check in your phone book. The CDC also has a tool to help you locate your state's health department, and many state websites also have listings of all of their local health departments.  3) Find a Walk-in Clinic If your illness is not likely to be very severe or complicated, you may want to try a walk in clinic. These are popping up all over the country in pharmacies, drugstores, and shopping centers. They're usually staffed by nurse practitioners or physician assistants that have been trained to treat common illnesses and complaints. They're usually fairly quick and inexpensive. However, if you have serious medical issues or chronic medical problems, these are probably not your best option.  No  Primary Care Doctor: - Call Health Connect at  [phone] - they can help you locate a primary care doctor that  accepts your insurance, provides certain services, etc. - Physician Referral Service- [phone]  Chronic Pain Problems: Organization         Address  Phone   Notes  [facility]  [phone]) [phone] Patients need to be referred by their primary care doctor.   Medication Assistance: Organization         Address  Phone   Notes  [facility] [address] [place], [place] [address] [phone] --Must be a resident of [place] [facility] [facility] -- Must have NO insurance coverage whatsoever (no Medicaid/ Medicare, etc.) -- The pt. MUST have a primary care doctor that directs their care regularly and follows them in the community   MedAssist  [phone]   [employer]  [phone]    Agencies that provide inexpensive medical care: Organization         Address  Phone   Notes  [facility]  [phone]   [facility]    [phone]) [phone]   [facility] [address] [place], [place] [address] [phone]   [facility] [address], [place] [phone]   [facility]anned Parenthood    [phone]   [facility]    [phone]) [phone]   [facility]  [address], [place] Phone:  [phone], Fax:  [phone] Hours of Operation:  9 am - 6 pm, M-F.  Also accepts Medicaid/Medicare and self-pay.  [facility]  [address], [place] Phone: [phone], Fax: [phone]. Hours of Operation:  8:30 am - 5:30 pm, M-F.  Also accepts Medicaid and self-pay.  [facility] [place] [address], [place] Phone: [phone]   [facility] [address] Natasha Bence [place], [place] [phone], Ext. [phone] Mondays & Thursdays: 7-9 AM.  First 15 patients are seen on  a first come, first serve basis.    Medicaid-accepting [facility] Providers:  Organization         Address  Phone   Notes  [facility] [address], [place] [phone] Also accepts self-pay patients.  [facility] [address] Laurell Josephs [place], [place]  [phone]   [facility] [address], [place] [phone]   [facility] [address], [place] [phone]   Renaye Rakers [address], [place]   [phone] Only accepts [place] Access [place] patients after they have their name applied to their card.   Self-Pay (no insurance) in [facility]:  Organization         Address  Phone   Notes  Sickle Cell Patients, [facility] Medicine [address] [place], [place] [phone]   [facility] [address] [place], [place] [phone]   [facility] [place]  [address],  [phone]   [facility]/Dr. Osei-Bonsu  [address] [place] or [address], [place] [phone] Phone number for both [place] and [place] locations is the same.  Urgent Medical and [facility] [address], [place] [phone]   [facility] [address], [place] or [address] [phone]   [facility] [address] [place] [phone], phone; [phone], fax Sees patients 1st and 3rd Saturday of every month.  Must not qualify for public or private insurance (i.e. Medicaid, Medicare, [place] Health Choice, Veterans' Benefits)  Household income should be no more than 200% of the poverty level The clinic cannot treat you if you are pregnant or think you are pregnant  Sexually transmitted diseases are not treated at the clinic.    Dental Care: Organization          Address  Phone  Notes  [facility] Department of [facility] [address] [place] [address] [place], [place] [phone] Accepts children up to age 74 who are enrolled in [place] or [place] Health Choice; pregnant women with a Medicaid card; and children who have applied for Medicaid or [place] Health Choice, but were declined, whose parents can pay a reduced fee at time of service.  [facility] Department of [facility]  [address], [place] [phone] Accepts children up to age 3 who are enrolled in [place] or [place] Health Choice; pregnant women with a Medicaid card; and children who have applied for Medicaid or [place] Health Choice, but were declined, whose parents can pay a reduced fee at time of service.  [facility]  [phone]  [place], [place] [phone] Patients are seen by appointment only. Walk-ins are not accepted. [place] will see patients 90 years of age and older. Monday - Tuesday (8am-5pm) Most Wednesdays (8:30-5pm) $30 per visit, cash only  [facility] Adult Dental Access PROGRAM  [address], [facility] [phone] Patients are seen by appointment only. Walk-ins are not accepted. [place] will see patients 1 years of age and older. One Wednesday Evening (Monthly: Volunteer Based).  $30 per visit, cash only  [place]  [phone] for adults; Children under age 81, call Graduate Pediatric Dentistry at [phone]. Children aged 1-14, please call [phone] to request a pediatric application.  Dental services are provided in all areas of dental care including fillings, crowns and bridges, complete and partial dentures, implants, gum treatment, root canals, and extractions. Preventive care is also provided. Treatment is provided to both adults and children. Patients are selected via a lottery and there is often a waiting list.   [facility] [address] [place] [address] [place]  [phone] [email or web address]   [facility] [address] [place], [place] [phone], Ext. [phone] Second and Fourth Thursday of each month, opens at 6:30 AM; Clinic ends at 9 AM.  Patients are seen on a first-come first-served basis, and a limited number are seen during each clinic.   [facility]  [address] Hillard Danker [place], [place] [phone]   Eligibility Requirements You must have lived in [place], [place], or [place] counties for at least the last three months.   You cannot be eligible for state or federal sponsored [employer], including [employer], [place], or [employer].   You generally cannot be eligible for healthcare insurance through your employer.    How to apply: Eligibility screenings are held every Tuesday and Wednesday afternoon from 1:00 pm until 4:00 pm. You do not need an appointment for the interview!  [facility] [address], [place], [place]   [place] Department  [place]  [phone]    Behavioral Health Resources in the Community: Intensive Outpatient Programs Organization         Address  Phone  Notes  [place]. [address], [place], [place] [phone]   [facility] Outpatient [address] [place], [place]   ADS: [facility] Drug S[facility]cs [address] [place], [place]   [place] [address]  [place], [place] or [phone]   Substance Abuse Resources Organization         Address  Phone  Notes  Alcohol and Drug Services  [phone]   [place]  [phone]   [facility] [place]   Chinita Pester  [phone]   Residential & Outpatient Substance Abuse Program  [phone]   Psychological  Services Organization         Address  Phone  Notes  [facility] [place]  [place]  [phone]   [place] [address] [place] or [phone]    Mobile Crisis Teams Organization         Address  Phone  Notes  [facility], Mobile Crisis Care Unit  [phone]   [facility]  [address] [place], [place]   Ivin Booty  [place] [address] [place] [phone]    Self-Help/Support Groups Organization         Address  Phone             Notes  [facility] [place] - variety of support groups  [phone]- I7437963 Call for more information  Narcotics Anonymous (NA), [facility] [address], [employer] [place]  2 meetings at this location   Statistician         Address  Phone  Notes  [facility] [address],    [place]  [phone]   [facility]  [address] [place] [address], [place], [place] [phone]   [facility] [address] [place], [place] [phone] Admissions: 8am-3pm M-F  [facility] [address]    [place], [place] [phone]   [facility] [address] [place], [place], [place] [phone]   [facility] [address],  [place], [place] [phone]   [facility] - Intensive Outpatient [address] Laurell Josephs [address], [place], [place] [phone]   [facility] (Addiction Recovery [facility] [address] [place].,  [place], [place] [phone] or [phone]   [facility] ([facility]) [address] [place], [place] [phone] Accepts Medicaid  [facility] [address],  [place] [phone] Substance Abuse/Addiction Treatment   [facility] Organization         Address  Phone  Notes  [facility]  [phone]   Angie Fava, PhD [address] Ervin Knack [place], [place]   [phone] or [phone]   [facility] Behavioral   [address] [place], [place] [phone]   [facility] [address], [place], [place] [phone] Insurance/Medicaid/sponsorship through [facility] and Families [address]                                    [place], [place] [phone] Therapy/tele-psych/case  [facility] [address], [place] [phone]    Dr. Lolly Mustache  [phone]   [facility] [place]  United Way [facility] Dept. 1) [address], [place] 2) [address], [place] 3)  [address], [place] [phone]  [phone]   [facility] Child Abuse Hotline 516-135-2277 or [phone] (After Hours)

## 2013-08-21 NOTE — ED Provider Notes (Signed)
CSN: [id number]     Arrival date & time 08/20/13  2259 History   First MD Initiated Contact with Patient 08/20/13 2340     Chief Complaint  Patient presents with  . Leg Pain  . Alcohol Intoxication     (Consider location/radiation/quality/duration/timing/severity/associated sxs/prior Treatment) HPI Comments: Pt is a 48 y/o male with known hx of alcoholism and cirrhosis of the liver. Presents with a complaint of left ankle pain status post open reduction and internal fixation in the recent past. The patient is intoxicated, he is slurring his words, he is not getting very much history, he answers in one or 2 word answers usually yes or no, he points to his right chest, right abdomen and left leg from the knee to the ankle and states pain.  Level 5 caveat applies secondary to alcohol intoxication    Patient is a 48 y.o. male presenting with intoxication. The history is provided by the patient.  Alcohol Intoxication    Past Medical History  Diagnosis Date  . Seizures     pt is poor historian but states thhat he has hx seizures  . Cirrhosis of liver    Past Surgical History  Procedure Laterality Date  . Orif ankle fracture Left 04/22/2013    Procedure: OPEN REDUCTION INTERNAL FIXATION (ORIF) ANKLE FRACTURE;  Surgeon: Shelda Pal, MD;  Location: [facility];  Service: Orthopedics;  Laterality: Left;   No family history on file. History  Substance Use Topics  . Smoking status: Former Smoker    Types: Cigarettes    Quit date: 04/23/2011  . Smokeless tobacco: Never Used  . Alcohol Use: Yes    Review of Systems  All other systems reviewed and are negative.      Allergies  Review of patient's allergies indicates no known allergies.  Home Medications   Current Outpatient Rx  Name  Route  Sig  Dispense  Refill  . naproxen (NAPROSYN) 500 MG tablet   Oral   Take 1 tablet (500 mg total) by mouth 2 (two) times daily with a meal.   30 tablet   0    BP 114/83  Pulse 93   Temp(Src) 98.4 F (36.9 C) (Oral)  Resp 18  SpO2 96% Physical Exam  Nursing note and vitals reviewed. Constitutional: He appears well-developed and well-nourished. No distress.  HENT:  Head: Normocephalic and atraumatic.  Mouth/Throat: Oropharynx is clear and moist. No oropharyngeal exudate.  Eyes: Conjunctivae and EOM are normal. Pupils are equal, round, and reactive to light. Right eye exhibits no discharge. Left eye exhibits no discharge. No scleral icterus.  Neck: Normal range of motion. Neck supple. No JVD present. No thyromegaly present.  Cardiovascular: Normal rate, regular rhythm, normal heart sounds and intact distal pulses.  Exam reveals no gallop and no friction rub.   No murmur heard. Pulmonary/Chest: Effort normal and breath sounds normal. No respiratory distress. He has no wheezes. He has no rales. He exhibits tenderness ( Right-sided chest tenderness, no deformity or crepitans, no bruising).  Abdominal: Soft. Bowel sounds are normal. He exhibits no distension and no mass. There is tenderness ( Tenderness of the abdomen diffusely, mild, soft, no peritoneal signs).  Musculoskeletal: Normal range of motion. He exhibits tenderness ( Left ankle tenderness to palpation, surgical scars are well healed on the lateral and medial surfaces of the ankle, normal range of motion without swelling, no lower extremity edema). He exhibits no edema.  No asymmetry of the lower extremities  Lymphadenopathy:  He has no cervical adenopathy.  Neurological: He is alert. Coordination normal.  Moves all extremities x4, the patient is slurring his speech, appears to be somnolent but easily arousable, swats at the air, pushes my hands away when I try to examine him and according to manner  Skin: Skin is warm and dry. No rash noted. No erythema.  Psychiatric: He has a normal mood and affect. His behavior is normal.    ED Course  Procedures (including critical care time) Labs Review Labs Reviewed   ETHANOL - Abnormal; Notable for the following:    Alcohol, Ethyl (B) 337 (*)    All other components within normal limits  COMPREHENSIVE METABOLIC PANEL - Abnormal; Notable for the following:    Glucose, Bld 104 (*)    Alkaline Phosphatase 151 (*)    Total Bilirubin <0.2 (*)    All other components within normal limits  LIPASE, BLOOD  CBC WITH DIFFERENTIAL   Imaging Review Dg Chest 1 View  08/21/2013   CLINICAL DATA:  Chest and abdominal pain.  History of smoking.  EXAM: CHEST - 1 VIEW  COMPARISON:  Chest radiograph performed 12/05/2012  FINDINGS: The lungs are well-aerated and clear. There is no evidence of focal opacification, pleural effusion or pneumothorax.  The cardiomediastinal silhouette is within normal limits. No acute osseous abnormalities are seen. There is chronic deformity of the right mid clavicle.  IMPRESSION: No acute cardiopulmonary process seen.   Electronically Signed   By: Roanna RaiderJeffery  Chang M.D.   On: 08/21/2013 01:13    EKG Interpretation   None       MDM   Final diagnoses:  Alcohol intoxication  Ankle pain    The patient appears intoxicated, he does have tenderness of the left lower extremity but no signs of DVT, is not hypoxic tachycardic and has no fever or hypotension. We'll allow him to sober up, labs, all pain medication until more awake.  We'll obtain a chest x-ray to rule out free air of the abdomen or chest wall injury or abnormality.  The patient has a benign exam, he is now clinically sober, vital signs remained stable, no signs of pneumothorax, free air or other pulmonary or musculoskeletal abnormality to suggest a cause of the patient's right-sided chest pain or right-sided abdominal pain or left lower ankle pain.  Meds given in ED:  Medications - No data to display  New Prescriptions   NAPROXEN (NAPROSYN) 500 MG TABLET    Take 1 tablet (500 mg total) by mouth 2 (two) times daily with a meal.       Vida RollerBrian D Najmo Pardue, MD 08/21/13 [phone]

## 2013-08-21 NOTE — ED Notes (Signed)
Pt continues to sleep,  I placed a warm blanket on him for comfort.  NAD

## 2013-09-11 ENCOUNTER — Emergency Department (HOSPITAL_COMMUNITY): Payer: Self-pay

## 2013-09-11 ENCOUNTER — Encounter (HOSPITAL_COMMUNITY): Payer: Self-pay | Admitting: Emergency Medicine

## 2013-09-11 ENCOUNTER — Emergency Department (HOSPITAL_COMMUNITY)
Admission: EM | Admit: 2013-09-11 | Discharge: 2013-09-11 | Disposition: A | Discharge status: Home / Self Care | Payer: Self-pay | Attending: Emergency Medicine | Admitting: Emergency Medicine

## 2013-09-11 DIAGNOSIS — G40909 Epilepsy, unspecified, not intractable, without status epilepticus: Secondary | ICD-10-CM | POA: Insufficient documentation

## 2013-09-11 DIAGNOSIS — Z87891 Personal history of nicotine dependence: Secondary | ICD-10-CM | POA: Insufficient documentation

## 2013-09-11 DIAGNOSIS — F10229 Alcohol dependence with intoxication, unspecified: Secondary | ICD-10-CM | POA: Insufficient documentation

## 2013-09-11 DIAGNOSIS — R109 Unspecified abdominal pain: Secondary | ICD-10-CM | POA: Insufficient documentation

## 2013-09-11 DIAGNOSIS — K7460 Unspecified cirrhosis of liver: Secondary | ICD-10-CM | POA: Insufficient documentation

## 2013-09-11 DIAGNOSIS — F10929 Alcohol use, unspecified with intoxication, unspecified: Secondary | ICD-10-CM

## 2013-09-11 LAB — COMPREHENSIVE METABOLIC PANEL
ALK PHOS: 135 U/L — AB (ref 39–117)
ALT: 22 U/L (ref 0–53)
AST: 26 U/L (ref 0–37)
Albumin: 3.7 g/dL (ref 3.5–5.2)
BUN: 9 mg/dL (ref 6–23)
CO2: 25 meq/L (ref 19–32)
Calcium: 8.4 mg/dL (ref 8.4–10.5)
Chloride: 106 mEq/L (ref 96–112)
Creatinine, Ser: 0.67 mg/dL (ref 0.50–1.35)
GFR calc non Af Amer: >90 mL/min (ref >90)
GLUCOSE: 106 mg/dL — AB (ref 70–99)
POTASSIUM: 4 meq/L (ref 3.7–5.3)
SODIUM: 146 meq/L (ref 137–147)
Total Bilirubin: <0.2 mg/dL — ABNORMAL LOW (ref 0.3–1.2)
Total Protein: 7.5 g/dL (ref 6.0–8.3)

## 2013-09-11 LAB — URINALYSIS, ROUTINE W REFLEX MICROSCOPIC
Bilirubin Urine: NEGATIVE
GLUCOSE, UA: NEGATIVE mg/dL
Hgb urine dipstick: NEGATIVE
Ketones, ur: NEGATIVE mg/dL
LEUKOCYTES UA: NEGATIVE
Nitrite: NEGATIVE
PROTEIN: NEGATIVE mg/dL
Specific Gravity, Urine: 1.005 (ref 1.005–1.030)
Urobilinogen, UA: 0.2 mg/dL (ref 0.0–1.0)
pH: 5.5 (ref 5.0–8.0)

## 2013-09-11 LAB — I-STAT CHEM 8, ED
BUN: 7 mg/dL (ref 6–23)
CALCIUM ION: 1.09 mmol/L — AB (ref 1.12–1.23)
Chloride: 106 mEq/L (ref 96–112)
Creatinine, Ser: 1.2 mg/dL (ref 0.50–1.35)
Glucose, Bld: 104 mg/dL — ABNORMAL HIGH (ref 70–99)
HCT: 45 % (ref 39.0–52.0)
Hemoglobin: 15.3 g/dL (ref 13.0–17.0)
Potassium: 3.6 mEq/L — ABNORMAL LOW (ref 3.7–5.3)
SODIUM: 147 meq/L (ref 137–147)
TCO2: 25 mmol/L (ref 0–100)

## 2013-09-11 LAB — CBC WITH DIFFERENTIAL/PLATELET
BASOS ABS: 0 10*3/uL (ref 0.0–0.1)
Basophils Relative: 0 % (ref 0–1)
Eosinophils Absolute: 0 10*3/uL (ref 0.0–0.7)
Eosinophils Relative: 1 % (ref 0–5)
HCT: 41.6 % (ref 39.0–52.0)
Hemoglobin: 14.6 g/dL (ref 13.0–17.0)
LYMPHS ABS: 1.8 10*3/uL (ref 0.7–4.0)
LYMPHS PCT: 27 % (ref 12–46)
MCH: 31.9 pg (ref 26.0–34.0)
MCHC: 35.1 g/dL (ref 30.0–36.0)
MCV: 91 fL (ref 78.0–100.0)
Monocytes Absolute: 0.4 10*3/uL (ref 0.1–1.0)
Monocytes Relative: 6 % (ref 3–12)
NEUTROS PCT: 67 % (ref 43–77)
Neutro Abs: 4.5 10*3/uL (ref 1.7–7.7)
PLATELETS: 254 10*3/uL (ref 150–400)
RBC: 4.57 MIL/uL (ref 4.22–5.81)
RDW: 13.4 % (ref 11.5–15.5)
WBC: 6.8 10*3/uL (ref 4.0–10.5)

## 2013-09-11 LAB — RAPID URINE DRUG SCREEN, HOSP PERFORMED
Amphetamines: NOT DETECTED
BARBITURATES: NOT DETECTED
BENZODIAZEPINES: NOT DETECTED
Cocaine: NOT DETECTED
Opiates: NOT DETECTED
TETRAHYDROCANNABINOL: NOT DETECTED

## 2013-09-11 LAB — ETHANOL: Alcohol, Ethyl (B): 414 mg/dL (ref 0–11)

## 2013-09-11 MED ORDER — SODIUM CHLORIDE 0.9 % IV BOLUS (SEPSIS)
1000.0000 mL | Freq: Once | INTRAVENOUS | Status: AC
  Administered 2013-09-11: 1000 mL via INTRAVENOUS

## 2013-09-11 MED ORDER — IOHEXOL 300 MG/ML  SOLN
100.0000 mL | Freq: Once | INTRAMUSCULAR | Status: AC | PRN
  Administered 2013-09-11: 100 mL via INTRAVENOUS

## 2013-09-11 NOTE — ED Provider Notes (Signed)
Medical screening examination/treatment/procedure(s) were performed by non-physician practitioner and as supervising physician I was immediately available for consultation/collaboration.   EKG Interpretation None      Deshea Pooley, MD, FACEP   Joscelyne Renville L Javonda Suh, MD 09/11/13 1632 

## 2013-09-11 NOTE — ED Notes (Signed)
Pt is up walking trying to pull iv out. Pt states that he wants to leave. Pt walking with no assistance, put his shirt on without assistance needed. PA at bedside to talk with pt. Pt wanting to leave. Travis Fry gave the ok for pt. Walked with steady gate and was given  A bus pass.

## 2013-09-11 NOTE — Discharge Instructions (Signed)
Read the information below.  You may return to the Emergency Department at any time for worsening condition or any new symptoms that concern you.   Problemas Con El Alcohol (Alcohol Problems) La mayora de los adultos que beben alcohol lo hacen con moderacin (no demasiado) y poseen bajo riesgo de Warehouse manager problemas relacionados con la bebida. Sin embargo, todos los bebedores, inclusive los de bajo riesgo, Gaffer los riesgos de la salud que conlleva la ingesta de alcohol. RECOMENDACIONES PARA LOS BEBEDORES DE BAJO RIESGO Beber con moderacin. La ingesta moderada de alcohol se establece de la siguiente manera:   Hombres  no ms de dos tragos Google.  Mujeres  no ms de un trago Google.  Mayores de 65 aos  no ms de un trago Air cabin crew. Un trago estndar es 12 gramos de alcohol puro, lo que es lo mismo que una botella de 12 onzas de cerveza o Merchant navy officer, un vaso de vino de 5 onzas, o 1 onza y media de bebidas blancas (como whisky, brandy, vodka, o ron).  ABSTNGASE (NO BEBA) ALCOHOL:  Si est embarazada o contempla la posibilidad.  Cuando tome un medicamento que interacta con el alcohol.  Si usted es dependiente del alcohol.  Sufre alguna enfermedad en la que se prohba el consumo de alcohol (como lceras, enfermedades hepticas, o enfermedades cardacas). HABLE CON EL MDICO:  Si tiene riesgo de sufrir una enfermedad coronaria, converse sobre los potenciales beneficios y riesgos de la ingesta de alcohol: La ingesta baja a moderada de alcohol est asociada con tasas menores de enfermedades coronarias en ciertas poblaciones (por ejemplo, hombres mayores de 45 aos y mujeres postmenopusicas). Se aconseja a las personas no bebedoras o abstemias no comenzar con la ingesta baja a moderada para reducir el riesgo de enfermedades coronarias en funcin de evitar la aparicin de un problema relacionado con el alcohol. Pueden obtenerse efectos protectores similares a travs de una dieta y  ejercicios adecuados.  Las mujeres y los ancianos tienen menos cantidad de agua en el [employer] Como consecuencia de Nixon, las mujeres y los ancianos tienen mayor concentracin de alcohol en la sangre despus de beber la misma cantidad de alcohol.  La exposicin del feto al alcohol puede ocasionar una gran cantidad de defectos de nacimientos dominados [place] Alcohlico Fetal (FAS) o Defectos de Nacimiento Relacionados con el Alcohol (ARBD). Aunque los FAS y los ARBD estn asociados con el consumo excesivo de alcohol durante el [place], tambin se han informado trastornos de Leisure centre manager en nios nacidos de madres que informaron haber bebido un trago en promedio por Mudlogger.  El abuso de alcohol (como el consumo de ms de cuatro tragos por ocasin en hombres y ms de tres tragos por ocasin en mujeres) perjudica a lo cognitivo (aprendizaje) y las funciones psicomotoras e incrementa el riesgo de problemas relacionados con el alcohol, inclusive accidentes y daos. PREGUNTAS CECA:   Algunas vez ha sentido que deba cortar con la bebida?  Se ha enojado usted con alguien que lo critic por lo que bebe?  Alguna vez se ha sentido mal o culpable por lo que bebe?  Ha tomado alguna vez un trago por la maana para calmar sus nervios o para deshacerse de una "resaca" (para "abrir los ojos")? Si ha respondido de [place] positiva a [place] de estas preguntas: Podra estar en riesgo de tener problemas relacionados con el alcohol si el consumo del mismo es:   Hombres: Mayor a 14 tragos por [employer]  o ms de 4 tragos por ocasin.  Mujeres: Mayor a 7 tragos por semana o ms de 3 tragos por ocasin. Tiene usted o su familia alguna historia clnica de problemas relacionados con el alcohol como:  Prdida del conocimiento.  Disfuncin sexual.  Depresin.  Traumatismos.  Enfermedades hepticas.  Trastornos del sueo.  Hipertensin arterial.  Dolor crnico  abdominal.  Alguna vez el beber le ha ocasionado problemas, como por ejemplo con su familia, en su rendimiento laboral (o escolar), accidentes o lesiones?  Ha tenido una compulsin a beber o se ha preocupado mientras lo haca?  Posee poco control o es incapaz de parar de beber una vez que ha comenzado?  Ha tenido que beber para evitar sntomas de abstinencia?  Ha tenido problemas con la abstinencia como temblores, nuseas, sudor o cambios en el humor?  Necesita ms alcohol que antes para emborracharse?  Siente una fuerte necesidad de beber?  Cambia de planes para poder beber?  Alguna vez ha bebido en la maana para aliviar temblores o resaca? Si ha respondido a [place]alguna de estas preguntas de Emerson Electricmanera positiva, puede que sea el momento de hablar con un profesional, familiar o amigos y ver si ellos creen que tiene un problema. El alcoholismo es una dependencia qumica que puede Theme park managerempeorar y Environmental health practitionerllegar a destruir su salud y Teacher, English as a foreign languagesus relaciones. Muchos alcohlicos mueren, se empobrecen o terminan en prisin. Esto es a menudo el resultado de una dependencia qumica.  No se desaliente si no est listo para actuar inmediatamente.  Las decisiones para cambiar su comportamiento a menudo implican altas y bajas entre el deseo de Saint Barthelemycambiar y la sensacin de que no puede decidirse.  Intente pensar ms seriamente sobre su comportamiento frente a la bebida.  Piense en razones para dejarla. PARA OBTENER INFORMACIN ADICIONAL, CONCURRIR A:  The General Millsational Institute on Alcohol Abuse and Alcoholism (NIAAA) BasicStudents.dkwww.niaaa.nih.gov   ToysRusational Council on Alcoholism and Drug Dependence (NCADD) www.ncadd.org  American Society of Addiction Medicine (ASAM) RoyalDiary.glwww.asam.org  Document Released: 09/23/2007 Document Revised: 09/08/2011 [facility] Patient Information 2014 [place]Care, [place].  Intoxicacin alcohlica (Alcohol Intoxication) La intoxicacin alcohlica se produce cuando la cantidad de alcohol que se ha consumido  daa la capacidad de funcionamiento mental y fsico. El alcohol deteriora directamente la actividad qumica normal del cerebro. Beber grandes cantidades de alcohol puede conducir a Building control surveyorcambios en el funcionamiento mental y en el comportamiento, y puede causar muchos efectos fsicos que pueden ser perjudiciales.  La intoxicacin alcohlica puede variar en gravedad desde leve hasta muy grave. Hay varios factores que pueden afectar el nivel de intoxicacin que se produce, como la edad de la persona, el sexo, el peso, la frecuencia de consumo de alcohol, y la presencia de otras enfermedades mdicas (como diabetes, convulsiones o enfermedades del corazn). Los niveles peligrosos de intoxicacin por alcohol pueden ocurrir Kerr-McGeecuando las personas beben grandes cantidades de alcohol en un corto periodo de tiempo (Dattoemborracharse). El alcohol tambin puede ser especialmente peligroso cuando se combina con ciertos medicamentos recetados o drogas "recreativas". SIGNOS Y SNTOMAS Algunos de los signos y sntomas comunes de intoxicacin leve por alcohol incluyen:  Prdida de la coordinacin.  Cambios en el estado de nimo y la conducta.  Incapacidad para razonar.  Hablar arrastrando las palabras. A medida que la intoxicacin por alcohol avanza a niveles ms graves, Zenaida Niecevan a Research officer, trade unionaparecer otros signos y sntomas. Estos pueden ser:  Vmitos.  Confusin y alteracin de Scientist, clinical (histocompatibility and immunogenetics)la memoria.  Disminucin de Engineer, manufacturing systemsla frecuencia respiratoria.  Convulsiones.  Prdida de la conciencia. DIAGNSTICO  El mdico le har una historia clnica y un examen fsico. Se le preguntar acerca de la cantidad y el tipo de alcohol que ha consumido. Se le realizarn anlisis de sangre para medir la concentracin de alcohol en sangre. En muchos lugares, el nivel de alcohol en la sangre debe ser inferior a 80 mg / dL (1,61%) para poder conducir legalmente. Sin embargo, hay muchos efectos peligrosos del alcohol que pueden ocurrir con niveles mucho ms bajos.   TRATAMIENTO  Las personas con intoxicacin por alcohol a menudo no requieren TEFL teacher. La mayor parte de los efectos de la intoxicacin por alcohol son temporales, y desaparecen a medida que el alcohol abandona el cuerpo de forma natural. El profesional controlar su estado hasta que est lo suficientemente estable como para volver a casa. A veces se administran lquidos por va intravenosa para ayudar a evitar la deshidratacin.  INSTRUCCIONES PARA EL CUIDADO EN EL HOGAR  No conduzca vehculos despus de beber alcohol.  Mantngase hidratado. Beba gran cantidad de lquido para mantener la orina de tono claro o color amarillo plido. Evite la cafena.   Tome slo medicamentos de venta libre o recetados, segn las indicaciones del mdico.  SOLICITE ATENCIN MDICA SI:   Tiene vmitos persistentes.   No mejora luego de [employer].  Se intoxica con alcohol con frecuencia. El mdico podr ayudarlo a decidir si debe consultar a un terapeuta especializado en el abuso de sustancias. SOLICITE ATENCIN MDICA DE INMEDIATO SI:   Se siente vacilante o tembloroso cuando trata de abandonar el hbito.   Comienza a temblar de manera incontrolable (convulsiones).   Vomita sangre. Puede ser sangre de color rojo brillante o similar al sedimento del caf negro.   Maxie Better en la materia fecal. Puede ser de color rojo brillante o de aspecto alquitranado, con olor ftido.   Se siente mareado o se desmaya.  ASEGRESE DE QUE:   Comprende estas instrucciones.  Controlar su afeccin.  Recibir ayuda de inmediato si no mejora o si empeora. Document Released: 06/16/2005 Document Revised: 02/16/2013 [facility] Patient Information 2014 [place], [place].  Behavioral Health Resources in the [facility]  Intensive Outpatient Programs: [facility]      [address] [place], [place] [phone] Both a day and evening program       [facility]  Outpatient     [address]        [place], [place] [address] [phone]         ADS: [facility] [address] [place] [phone]  [facility] Mental Health ACCESS LINE: [phone] or [phone] [address] [place], [place] [address] [email or web address]  Mobile Crisis Teams:                                        [facility]         Mobile Crisis Care Unit [phone]             [facility] [address] Ginette Otto [phone]                                         Interventionist [address] [place] [phone]  Self-Help/Support Groups: Mental Health Assoc. of The [employer] of  support groups [phone] (call for more info)  Narcotics Anonymous (NA) [facility] [address] [place] - 2 meetings at this location  Residential Treatment Programs:  [facility]      [address]        [place]       [phone]         [facility] [address], [place] [address] [place], [place]  [phone]  [facility]  [address] [place], [place] [address] [phone] Admissions: 8am-3pm M-F  [facility]     [address]        [place], [place] [address]       [phone]         [facility] [address] [place], [place] [phone]  [facility] [address] [facility] [place], [place] [phone]  [facility] - Intensive Outpatient      [address]     [place], [place]       [phone]         [facility]     [address] [place], [place] [phone] or [phone]  [facility] ([facility])  [address] [place], [place] [phone]  [facility] [address]                                               [address] [place] [phone]  [facility] Resources: [place] [phone]               General Therapy                                                Angie Fava, PhD        [address] [place], [place] [address]         [phone]   Insurance  [facility]   [address] [place], [place] [address] [phone]  [facility] [address] [place], [place] [address] [phone] Insurance/Medicaid/sponsorship through [facility]                                              [address]                                        [place], [place] [address]    Therapy/tele-psych/case         [phone]          [facility] [address] [place]  [address]  Adolescent/group home/case management [phone]  Rosette Reveal PhD       General therapy       Insurance   [phone]         Dr. Adele Schilder Insurance [id number] M-F  [facility] Medicaid, sponsorship [id number]

## 2013-09-11 NOTE — ED Notes (Signed)
Patient transported to CT 

## 2013-09-11 NOTE — ED Notes (Signed)
Per ems: The patient reported drinking a lot last night. The patient is homeless,and was eating breakfast when he complained of SOB and EMS was called. The patient on arrival is disoriented and has no complaints.

## 2013-09-11 NOTE — ED Provider Notes (Signed)
CSN: [id number]     Arrival date & time 09/11/13  1014 History   First MD Initiated Contact with Patient 09/11/13 1029     Chief Complaint  Patient presents with  . Alcohol Intoxication     (Consider location/radiation/quality/duration/timing/severity/associated sxs/prior Treatment) The history is provided by the patient.    Patient with known hx alcoholism brought in by EMS with two different reported stories.  One is that patient was found lying on the sidewalk in front of a fast food restaurant.  The other that patrons bought the patient breakfast and while he was eating he complained to the staff of SOB.  Upon arrival, patient was attempting to leave.  He has slurred speech and does not communicate well but denied any pain or problems until I examined him, then he began clutching his right abdomen and indicating pain.   Level V caveat for intoxication  Past Medical History  Diagnosis Date  . Seizures     pt is poor historian but states thhat he has hx seizures  . Cirrhosis of liver    Past Surgical History  Procedure Laterality Date  . Orif ankle fracture Left 04/22/2013    Procedure: OPEN REDUCTION INTERNAL FIXATION (ORIF) ANKLE FRACTURE;  Surgeon: Shelda Pal, MD;  Location: [facility];  Service: Orthopedics;  Laterality: Left;   History reviewed. No pertinent family history. History  Substance Use Topics  . Smoking status: Former Smoker    Types: Cigarettes    Quit date: 04/23/2011  . Smokeless tobacco: Never Used  . Alcohol Use: Yes    Review of Systems  Unable to perform ROS: Mental status change      Allergies  Review of patient's allergies indicates no known allergies.  Home Medications   Current Outpatient Rx  Name  Route  Sig  Dispense  Refill  . naproxen (NAPROSYN) 500 MG tablet   Oral   Take 1 tablet (500 mg total) by mouth 2 (two) times daily with a meal.   30 tablet   0    BP 96/61  Pulse 94  Temp(Src) 98.4 F (36.9 C) (Oral)  Resp 16   SpO2 100% Physical Exam  Nursing note and vitals reviewed. Constitutional: He appears well-developed and well-nourished. He is active. No distress.  Intoxicated  HENT:  Head: Normocephalic and atraumatic.  Neck: Neck supple.  Cardiovascular: Normal rate and regular rhythm.   Pulmonary/Chest: Effort normal and breath sounds normal. No respiratory distress. He has no wheezes. He has no rales.  Abdominal: Soft. He exhibits no distension. There is tenderness in the right upper quadrant and right lower quadrant. There is no rebound and no guarding.  Neurological: He is alert. GCS eye subscore is 4. GCS verbal subscore is 5. GCS motor subscore is 6.  Pt moves all extremities but does not follow commands.   Skin: He is not diaphoretic.    ED Course  Procedures (including critical care time) Labs Review Labs Reviewed  COMPREHENSIVE METABOLIC PANEL - Abnormal; Notable for the following:    Glucose, Bld 106 (*)    Alkaline Phosphatase 135 (*)    Total Bilirubin <0.2 (*)    All other components within normal limits  ETHANOL - Abnormal; Notable for the following:    Alcohol, Ethyl (B) 414 (*)    All other components within normal limits  I-STAT CHEM 8, ED - Abnormal; Notable for the following:    Potassium 3.6 (*)    Glucose, Bld 104 (*)  Calcium, Ion 1.09 (*)    All other components within normal limits  CBC WITH DIFFERENTIAL  URINE RAPID DRUG SCREEN (HOSP PERFORMED)  URINALYSIS, ROUTINE W REFLEX MICROSCOPIC   Imaging Review Ct Head Wo Contrast  09/11/2013   CLINICAL DATA:  Seizure, altered level of consciousness, possible head trauma  EXAM: CT HEAD WITHOUT CONTRAST  TECHNIQUE: Contiguous axial images were obtained from the base of the skull through the vertex without intravenous contrast.  COMPARISON:  CT HEAD W/O CM dated 12/05/2012; CT MAXILLOFACIAL W/O CM dated 12/05/2012; CT C SPINE W/O CM dated 12/05/2012; CT HEAD W/O CM dated 11/25/2012  FINDINGS: The right MCA is slightly denser than  the left without other evidence for acute stroke. This is most likely projectional given comparison with prior exams. No acute hemorrhage, infarct, or mass lesion is identified. Right high frontal scalp swelling and possible laceration noted image 39. No skull fracture. Evidence of previous facial fracture fixation partly visualized. No midline shift. Orbits are grossly unremarkable. No paranasal sinus air-fluid level or significant opacification. Diffuse mild cortical volume loss noted with proportional ventricular prominence, slightly out of proportion to that typically seen at the patient's age. Left frontal encephalomalacia reidentified.  IMPRESSION: No acute intracranial finding.  Possible right high frontal scalp laceration/swelling. Correlate clinically.   Electronically Signed   By: Christiana PellantGretchen  Green M.D.   On: 09/11/2013 11:43   Ct Abdomen Pelvis W Contrast  09/11/2013   CLINICAL DATA:  Alcohol intoxication. Right-sided tenderness. Questionable trauma history. Clinical history describes cirrhosis of the liver.  EXAM: CT ABDOMEN AND PELVIS WITH CONTRAST  TECHNIQUE: Multidetector CT imaging of the abdomen and pelvis was performed using the standard protocol following bolus administration of intravenous contrast.  CONTRAST:  100mL OMNIPAQUE IOHEXOL 300 MG/ML  SOLN  COMPARISON:  DG ABD ACUTE W/CHEST dated 09/11/2013; CT ABD/PELVIS W CM dated 08/09/2013; CT ABD/PELVIS W CM dated 11/25/2012  FINDINGS: Lower Chest: Mild motion degradation. No basilar pneumothorax. Heart size upper normal, without pericardial or pleural effusion. 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 2.8 cm on image 11.  Abdomen/Pelvis: Normal liver, adrenal glands. Old granulomatous disease in the spleen. Normal stomach, pancreas, gallbladder, biliary tract, adrenal glands, right kidney. Partially duplicated left renal collecting system. No retroperitoneal or retrocrural adenopathy. Normal colon, appendix, and terminal  ileum. Normal small bowel without abdominal ascites. Mild motion degradation. Increased density in the jejunal mesenteric fat with small nodes within. Example image 34. Similar. No pneumatosis or free intraperitoneal air.  No pelvic adenopathy. Normal urinary bladder and prostate. No significant free fluid.  Bones/Musculoskeletal: Anterior right sixth rib fracture is nonacute.  IMPRESSION: 1. Mildly motion degraded exam. 2. Given this factor, no acute or posttraumatic deformity identified. 3. Despite the clinical history, no evidence of cirrhosis. 4. Esophageal duplication cyst, similar. 5. Jejunal mesenteric findings which are suspicious for mild mesenteric adenitis/panniculitis. 6. Remote right rib fracture.   Electronically Signed   By: Jeronimo GreavesKyle  Talbot M.D.   On: 09/11/2013 11:51   Dg Abd Acute W/chest  09/11/2013   CLINICAL DATA:  Right lower quadrant tenderness and guarding. Cirrhosis.  EXAM: ACUTE ABDOMEN SERIES (ABDOMEN 2 VIEW & CHEST 1 VIEW)  COMPARISON:  Chest radiograph on 08/21/2013  FINDINGS: There is no evidence of dilated bowel loops or free intraperitoneal air. No radiopaque calculi or other significant radiographic abnormality is seen. Heart size and mediastinal contours are within normal limits. Both lungs are clear. Old right clavicle fracture deformity again noted.  IMPRESSION: Negative abdominal  radiographs.  No acute cardiopulmonary disease.   Electronically Signed   By: Myles Rosenthal M.D.   On: 09/11/2013 11:18     EKG Interpretation None      12:24 PM Pt is ambulatory, no complaints, slurs his words but communicates - states he wants a bus pass.  Intoxicated but appears to be functioning at his baseline.  Staff who are familiar with him state this is his baseline.   MDM   Final diagnoses:  Alcohol intoxication    Pt with alcohol intoxication, brought in by EMS for complaints of SOB.  Pt denies SOB to me and lungs CTAB.  CXR negative.  On exam, pt had significant right sided  abdominal tenderness.  Per police, patient previously hung out on near busy streets and drinks heavily, huffs paint - could possibly have had trauma.  He does have some small areas of bruising - one small area of right upper arm and one small area on right chest wall - but these are old and healing.  No obvious trauma.  Pt unable to give much history - CT head, abd/pelvis unremarkable.  Possible mesenteric adenitis.  Remote right rib fracture.  Pt ate and drank while in ED then decided he needed to leave immediately.  He was walking around his room asking to speak to me and requesting a bus pass.  He denied any pain or concerns and requested to be discharged.  As he was walking normally and per staff was at his baseline and was communicating at his baseline and had no complaints and no findings on labs, urine, or imaging that required that he stay, he was discharged home.      [place], PA-C 09/11/13 [phone]

## 2014-03-10 ENCOUNTER — Encounter (HOSPITAL_COMMUNITY): Payer: Self-pay | Admitting: Emergency Medicine

## 2014-03-10 ENCOUNTER — Emergency Department (HOSPITAL_COMMUNITY)
Admission: EM | Admit: 2014-03-10 | Discharge: 2014-03-10 | Disposition: A | Discharge status: Home / Self Care | Payer: Self-pay | Attending: Emergency Medicine | Admitting: Emergency Medicine

## 2014-03-10 ENCOUNTER — Emergency Department (HOSPITAL_COMMUNITY): Payer: Self-pay

## 2014-03-10 DIAGNOSIS — F17200 Nicotine dependence, unspecified, uncomplicated: Secondary | ICD-10-CM | POA: Insufficient documentation

## 2014-03-10 DIAGNOSIS — R109 Unspecified abdominal pain: Secondary | ICD-10-CM | POA: Insufficient documentation

## 2014-03-10 DIAGNOSIS — F10929 Alcohol use, unspecified with intoxication, unspecified: Secondary | ICD-10-CM

## 2014-03-10 DIAGNOSIS — F1010 Alcohol abuse, uncomplicated: Secondary | ICD-10-CM | POA: Insufficient documentation

## 2014-03-10 DIAGNOSIS — Z8719 Personal history of other diseases of the digestive system: Secondary | ICD-10-CM | POA: Insufficient documentation

## 2014-03-10 LAB — COMPREHENSIVE METABOLIC PANEL
ALT: 35 U/L (ref 0–53)
AST: 33 U/L (ref 0–37)
Albumin: 3.5 g/dL (ref 3.5–5.2)
Alkaline Phosphatase: 124 U/L — ABNORMAL HIGH (ref 39–117)
Anion gap: 17 — ABNORMAL HIGH (ref 5–15)
BUN: 7 mg/dL (ref 6–23)
CALCIUM: 8.3 mg/dL — AB (ref 8.4–10.5)
CO2: 21 mEq/L (ref 19–32)
CREATININE: 0.6 mg/dL (ref 0.50–1.35)
Chloride: 99 mEq/L (ref 96–112)
GFR calc Af Amer: >90 mL/min (ref >90)
Glucose, Bld: 102 mg/dL — ABNORMAL HIGH (ref 70–99)
Potassium: 3.5 mEq/L — ABNORMAL LOW (ref 3.7–5.3)
Sodium: 137 mEq/L (ref 137–147)
TOTAL PROTEIN: 8 g/dL (ref 6.0–8.3)
Total Bilirubin: <0.2 mg/dL — ABNORMAL LOW (ref 0.3–1.2)

## 2014-03-10 LAB — ETHANOL
ALCOHOL ETHYL (B): 268 mg/dL — AB (ref 0–11)
Alcohol, Ethyl (B): 373 mg/dL — ABNORMAL HIGH (ref 0–11)

## 2014-03-10 LAB — CBC WITH DIFFERENTIAL/PLATELET
BASOS PCT: 1 % (ref 0–1)
Basophils Absolute: 0.1 10*3/uL (ref 0.0–0.1)
EOS ABS: 0.1 10*3/uL (ref 0.0–0.7)
Eosinophils Relative: 1 % (ref 0–5)
HEMATOCRIT: 38.5 % — AB (ref 39.0–52.0)
Hemoglobin: 13.4 g/dL (ref 13.0–17.0)
Lymphocytes Relative: 33 % (ref 12–46)
Lymphs Abs: 1.9 10*3/uL (ref 0.7–4.0)
MCH: 31.9 pg (ref 26.0–34.0)
MCHC: 34.8 g/dL (ref 30.0–36.0)
MCV: 91.7 fL (ref 78.0–100.0)
MONO ABS: 0.4 10*3/uL (ref 0.1–1.0)
Monocytes Relative: 7 % (ref 3–12)
Neutro Abs: 3.3 10*3/uL (ref 1.7–7.7)
Neutrophils Relative %: 58 % (ref 43–77)
Platelets: 283 10*3/uL (ref 150–400)
RBC: 4.2 MIL/uL — ABNORMAL LOW (ref 4.22–5.81)
RDW: 14 % (ref 11.5–15.5)
WBC: 5.7 10*3/uL (ref 4.0–10.5)

## 2014-03-10 LAB — LIPASE, BLOOD: Lipase: 28 U/L (ref 11–59)

## 2014-03-10 NOTE — ED Provider Notes (Signed)
Medical screening examination/treatment/procedure(s) were conducted as a shared visit with non-physician practitioner(s) and myself.  I personally evaluated the patient during the encounter.   EKG Interpretation None     Medical screening examination/treatment/procedure(s) were conducted as a shared visit with non-physician practitioner(s) and myself.  I personally evaluated the patient during the encounter.   EKG Interpretation None     Patient with alcohol intoxication. Labs reassuring. Patient's mental status has improved. D/c  Juliet Rude. Rubin Payor, MD 03/10/14 [phone]

## 2014-03-10 NOTE — ED Notes (Signed)
Pt has been walking back and forth independently to the bathroom.    Feels that he is ready to go.

## 2014-03-10 NOTE — ED Provider Notes (Signed)
CSN: [id number]     Arrival date & time 03/10/14  1550 History   First MD Initiated Contact with Patient 03/10/14 1604     Chief Complaint  Patient presents with  . Alcohol Intoxication     (Consider location/radiation/quality/duration/timing/severity/associated sxs/prior Treatment) HPI Level V - Alcohol intoxication  Pt to the ER with complaints of abdominal pains and ETOH intoxication. He reports drinking a lot of alcohol today, he is unable to tell me how much he has drank. He intermittently grabs his stomach and says it hurts. Unable to obtain any more information. He is slurring his speech and difficulty to understand. No obvious injuries, he smells of alcohol and urine.  Past Medical History  Diagnosis Date  . Seizures     pt is poor historian but states thhat he has hx seizures  . Cirrhosis of liver    Past Surgical History  Procedure Laterality Date  . Orif ankle fracture Left 04/22/2013    Procedure: OPEN REDUCTION INTERNAL FIXATION (ORIF) ANKLE FRACTURE;  Surgeon: Shelda Pal, MD;  Location: [facility];  Service: Orthopedics;  Laterality: Left;   No family history on file. History  Substance Use Topics  . Smoking status: Current Every Day Smoker    Types: Cigarettes  . Smokeless tobacco: Never Used  . Alcohol Use: Yes    Review of Systems  Level V - Alcohol intoxication  Allergies  Review of patient's allergies indicates no known allergies.  Home Medications   Prior to Admission medications   Not on File   BP 99/68  Pulse 79  Temp(Src) 98 F (36.7 C) (Oral)  Resp 16  SpO2 98% Physical Exam  Nursing note and vitals reviewed. Constitutional: He appears well-developed and well-nourished. No distress.  HENT:  Head: Normocephalic and atraumatic.  Eyes: Pupils are equal, round, and reactive to light.  Neck: Normal range of motion. Neck supple.  Cardiovascular: Normal rate and regular rhythm.   Pulmonary/Chest: Effort normal.  Abdominal: Soft. Bowel  sounds are normal. There is tenderness.    Neurological: He is alert.  Intoxicated. Unable to assess neuro exam  Skin: Skin is warm and dry.  Psychiatric: His speech is slurred.    ED Course  Procedures (including critical care time) Labs Review Labs Reviewed  ETHANOL - Abnormal; Notable for the following:    Alcohol, Ethyl (B) 373 (*)    All other components within normal limits  CBC WITH DIFFERENTIAL - Abnormal; Notable for the following:    RBC 4.20 (*)    HCT 38.5 (*)    All other components within normal limits  COMPREHENSIVE METABOLIC PANEL - Abnormal; Notable for the following:    Potassium 3.5 (*)    Glucose, Bld 102 (*)    Calcium 8.3 (*)    Alkaline Phosphatase 124 (*)    Total Bilirubin <0.2 (*)    Anion gap 17 (*)    All other components within normal limits  ETHANOL - Abnormal; Notable for the following:    Alcohol, Ethyl (B) 268 (*)    All other components within normal limits  LIPASE, BLOOD    Imaging Review Dg Abd 2 Views  03/10/2014   CLINICAL DATA:  Abdominal pain.  EXAM: ABDOMEN - 2 VIEW  COMPARISON:  09/11/2013  FINDINGS: Nondistended gas-filled loops of small bowel are identified. There is no evidence of dilated bowel loops. There is no evidence of free air. No radio-opaque calculi or other significant radiographic abnormality is seen.  IMPRESSION: Nonspecific nonobstructive  bowel gas pattern. No evidence of pneumoperitoneum.   Electronically Signed   By: Laveda Abbe M.D.   On: 03/10/2014 18:14     EKG Interpretation None      MDM   Final diagnoses:  Alcohol intoxication, with unspecified complication    Pt admits to drinking a significant amount of alcohol. Complains of abdominal pains.   6:08 pm ETOh elevated at 373. CBC shows no elevated white count, hemoglobin WNL. Potassium is mildly low. Abd 2 view xray pending.  6:19 pm Xray of abdomen has returned and is reassuring. Patient on monitor and sleeping. When I push on his abdomen while he  sleeps he does not wake in pain. Will continue to monitor closely and re-evaluate as he sobers up, unless clinical picture changes.  7:40pm Pt is awake and eating. Continues to move slow and have slurred speech. Filed Vitals:   03/10/14 1927  BP: 99/68  Pulse: 79  Temp: 98 F (36.7 C)  Resp: 16   9:09pm Patient has walked to the bathroom on his own without difficulty. He requests to leave. Denies SI/HI or hallucinations. Still complaints of some mild abdominal pain but denies wanting any pain medication or work up for this. His lab work does not show any acute abnormalities that would suggest patient should sign out AMA. Also plan films were not acute. Patient free to leave.   48 y.o.Travis Fry's evaluation in the Emergency Department is complete. It has been determined that no acute conditions requiring further emergency intervention are present at this time. The patient/guardian have been advised of the diagnosis and plan. We have discussed signs and symptoms that warrant return to the ED, such as changes or worsening in symptoms.  Vital signs are stable at discharge. Filed Vitals:   03/10/14 1927  BP: 99/68  Pulse: 79  Temp: 98 F (36.7 C)  Resp: 16    Patient/guardian has voiced understanding and agreed to follow-up with the PCP or specialist.   Dorthula Matas, PA-C 03/10/14 2112

## 2014-03-10 NOTE — ED Notes (Signed)
Bed: [facility] Expected date:  Expected time:  Means of arrival:  Comments: EMS-40 oz

## 2014-03-10 NOTE — Discharge Instructions (Signed)
Alcohol y nutricin (Alcohol and Nutrition) La nutricin [employer]. Proporciona energa y [employer] funciones y la estructura del cuerpo. Los alimentos nos proveen de Teacher, early years/pre. Tambin generan el material necesario para reemplazar clulas gastadas o daadas. Los alcohlicos suelen comer poco, lo que limita el suministro de nutrientes esenciales. De esta manera se afecta la energa y el mantenimiento de las estructuras corporales. El alcohol tambin afecta los nutrientes en los procesos de:  Jacona.  Almacenamiento.  Utilizacin y desecho de desperdicios. DIFICULTAD EN LA DIGESTIN Y [name] Chavez DE LOS NUTRIENTES  Una vez ingeridos, los alimentos se descomponen en fragmentos pequeos (digestin). As estar disponible como energa. Esto ayuda a [employer] funciones y las estructuras del organismo. La digestin comienza en la boca. Contina en el estmago y los intestinos, con ayuda del pncreas. Los nutrientes de los alimentos digeridos se absorben en los intestinos y pasan a Herbalist. Luego van hacia el hgado. El hgado prepara los nutrientes para:  Su uso inmediato.  Almacenamiento y uso futuro.  El alcohol inhibe la descomposicin de los nutrientes en molculas utilizables.  Disminuye la secrecin de enzimas digestivas desde el pncreas.  Disminuye la absorcin de nutrientes y daa las clulas de las paredes del estmago y los intestinos.  Tambin interfiere en el paso de algunos nutrientes [employer].  Adems, el mismo dficit nutricional podra causar otros problemas de absorcin.  Por ejemplo, la deficiencia de folato cambia las clulas que bordean el intestino delgado. Esto dificulta la absorcin del agua. Tambin afecta la absorcin de nutrientes. Entre ellos se incluyen la glucosa, el sodio y el folato adicional.  Incluso aunque los nutrientes se digieran y se absorban, el alcohol puede evitar su completa utilizacin. Cambia el transporte,  almacenamiento y excrecin. La reduccin de la utilizacin de nutrientes en personas alcohlicas se produce por:  Disminucin del almacenamiento de vitaminas en el hgado, como la vitamina A.  Aumento de excrecin de nutrientes como la grasa. ALCOHOL Y SUMINISTRO DE ENERGA  Tres componentes bsicos que se encuentran en los alimentos, son:  Carbohidratos.  Protenas.  Grasas.  stas se Oncologist. Algunos alcohlicos toman el 96% de sus caloras diarias del alcohol. Generalmente saltean comidas importantes.  An cuando se come de forma suficiente, el alcohol puede daar la forma en que el cuerpo controla los niveles de Museum/gallery exhibitions officer (glucosa). Puede tanto aumentar como disminuir el Dispensing optician.  En alcohlicos no diabticos, el aumento de Museum/gallery exhibitions officer (hiperglucemia) est ocasionada por una baja secrecin de insulina. Generalmente es transitoria.  La disminucin del Museum/gallery exhibitions officer (hipoglucemia) puede causar daos graves incluso si este trastorno es de corta duracin. Puede ocurrir una baja de azcar si una persona desnutrida o en ayunas bebe alcohol. Cuando no hay un suministro de energa suficiente, se Theme park manager. Estos productos del alcohol inhiben la formacin de glucosa de otros [employer] aminocidos. Como [place], el alcohol ocasiona una falta de glucosa en el cerebro y 46 tejidos corporales. La glucosa es necesaria para proporcionar energa y para una correcta funcin.  El alcohol es una fuente de [place]. Pero la forma en que el cuerpo procesa y [place] la energa del alcohol es compleja. Adems, cuando se sustituyen los carbohidratos por el alcohol, las personas tienden a Administrator, Civil Service. Esto indica que reciben menos energa del alcohol que de los alimentos. ALCOHOL MANTENIMIENTO DE LA [place] clulas estn formadas mayormente por protenas. Por  lo que una dieta [place] en protenas es  importante para Consulting civil engineer Teacher, English as a foreign language. Esto es especialmente importante si las clulas estn daadas. La investigacin indica que el alcohol afecta la nutricin de protenas por que deteriora:  La digestin de protenas en aminocidos.  El procesamiento de aminocidos por el intestino delgado y Engineer, maintenance (IT).  La sntesis de protenas desde aminocidos.  Secrecin de protenas del hgado. Funcin Los nutrientes son esenciales para que el cuerpo funcione bien. Proporcionan las herramientas que el cuerpo necesita para funcionar correctamente.   Protenas.  Vitaminas.  Minerales. El alcohol puede daar el funcionamiento del cuerpo. Puede ocasionar deficiencias nutricionales. Y puede interferir en la manera en que se procesan los nutrientes. Vitaminas  Las vitaminas son esenciales para Contractor crecimiento y el metabolismo normales. Estas regulan muchas funciones de los procesos del cuerpo. El alcoholismo crnico puede causar deficiencias de muchas vitaminas. Esto se origina por Special educational needs teacher. Y en algunos casos, las vitaminas no se absorben bien. Por ejemplo, el alcohol inhibe la absorcin de grasas. Afecta la forma en que las vitaminas A, E y D se absorben normalmente junto con las [place]. Una cantidad insuficiente de vitamina A puede causar ceguera nocturna. Una cantidad insuficiente de vitamina D puede causar debilitamiento en los huesos.  Algunos alcohlicos carecen de vitaminas A, C, D, E, K, y B. Estas son las que intervienen en la curacin de heridas y el mantenimiento de clulas. En particular, debido a que la vitamina K es necesaria para la coagulacin de la sangre, la falta de vitaminas puede retardar la coagulacin. Como resultado aparece una hemorragia excesiva. La falta de otras vitaminas que intervienen en la funcin cerebral pueden causar daos neurolgicos graves. Minerales La deficiencia de minerales como calcio, magnesio, hierro y zinc son comunes en las Engineer, building services. El alcohol por s mismo no parece afectar la forma en que estos minerales se absorben. En cambio, parecen ser secundarios a otros problemas relacionados con el alcohol, como:  Menor absorcin de calcio.  Magnesio insuficiente.  Mayor excrecin urinaria.  Vmitos  Diarrea.  Hierro insuficiente debido a una hemorragia gastrointestinal.  Zinc insuficiente o prdidas relacionadas con otras deficiencias nutricionales.  Las deficiencias nutricionales pueden causar una variedad de consecuencias mdicas. Estas van desde enfermedades en los huesos relacionadas con la falta de calcio a ceguera nocturna y lesiones en la piel relacionadas con la falta de zinc. DESNUTRICIN Y COMPLICACIONES MDICAS DEBIDAS AL ALCOHOLISMO Enfermedad heptica  El dao al hgado est causado por el alcohol mismo. Pero la desnutricin puede aumentar el riesgo de dao al hgado relacionado con el alcohol. Por ejemplo, los nutrientes que normalmente se encuentran en el hgado se ven afectados por la ingesta de alcohol. Esto incluye a los carotenoides, que son la fuente principal de vitaminas A y E. La disminucin de estos nutrientes pueden jugar un rol en el dao al hgado relacionado con el alcohol. Pancreatitis  La investigacin sugiere que la desnutricin puede aumentar el riesgo de desarrollar pancreatitis alcohlica. Tambin, que una dieta carente de protenas puede aumentar el efecto daino del alcohol en el pncreas. Cerebro  Las deficiencias nutricionales pueden tener efectos graves en el funcionamiento del cerebro. Estos pueden ser permanentes. Especficamente, las deficiencias de tiamina se Chemical engineer. Esto puede causar graves problemas neurolgicos. Ellos son:  Dificultades motoras.  Prdida de memoria observada en el sndrome de Wernicke/Korsakoff. Embarazo  El alcohol tiene efectos txicos en el desarrollo fetal. Produce defectos de nacimiento. Esto incluye el sndrome  alcohlico fetal. El alcohol es txico para el feto. Tambin, las deficiencias nutricionales pueden afectar la forma en que se desarrolla el feto. Esto agrava el riesgo de problemas de desarrollo.  Las necesidades nutricionales durante el embarazo son Eusebio Me un 10% y un 30% mayores que lo normal. La ingesta de alimentos puede aumentar hasta un 140% para cubrir las necesidades nutricionales de la madre y el feto. Una madre con problemas nutricionales por alcoholismo puede afectar la nutricin del feto. Y el alcohol en s puede restringir el flujo nutritivo hacia el feto. ESTADO NUTRICIONAL DE LOS ALCHLICOS Las tcnicas para evaluar el estado nutricional incluyen:  La toma de medidas del cuerpo para estimar las reservas de [employer]. Ellas son:  Sabino Snipes.  Masa.  Grosor del pliegue cutneo.  La realizacin de anlisis de sangre para proporcionar medidas de la circulacin de:  Protenas  Vitaminas.  Minerales.  Estas tcnicas suelen ser imprecisas. Para muchos nutrientes, no existe un punto de corte que permita una definicin de deficiencia certera. Por lo que evaluar el estado nutricional de los alcohlicos est limitado por estas tcnicas. El estado nutricional puede proporcionar informacin acerca del riesgo de desarrollar problemas nutricionales. El estado nutricional est evaluado por:  Historia clnica del [place].  Evaluacin de la cantidad y tipos de alimentos que se consumen.  Es difcil determinar qu cantidad exacta del alcohol comienza a Horticulturist, commercial nutricin. En general los bebedores moderados consumen dos vasos o menos por da. Parecen tener riesgo bajo de problemas nutricionales. Varios trastornos mdicos comienzan a Research officer, trade union a Journalist, newspaper.  La investigacin indica que la mayora de los bebedores, incluidos los ms asiduos, tienen pocas deficiencias nutricionales que resulten evidentes. Muchos alcohlicos que estn hospitalizados por complicaciones mdicas de  una enfermedad tienen desnutricin grave. Los alcohlicos suelen comer poco. A menudo comen menos de lo necesario para obtener la cantidad suficiente de:  Carbohidratos.  Protenas.  Grasas.  Vitaminas A y C.  Vitamina B.  Minerales como calcio y hierro. Es de Air traffic controller en la digestin de los alimentos y el uso de nutrientes. Podra cambiar de desnutricin leve a grave. Document Released: 12/04/2009 Document Revised: 09/08/2011 [facility] Patient Information 2015 [place], [place]. This information is not intended to replace advice given to you by your health care provider. Make sure you discuss any questions you have with your health care provider.  Intoxicacin alcohlica (Alcohol Intoxication) La intoxicacin alcohlica se produce cuando la cantidad de alcohol que se ha consumido daa la capacidad de funcionamiento mental y fsico. El alcohol deteriora directamente la actividad qumica normal del cerebro. Beber grandes cantidades de alcohol puede conducir a Building control surveyor funcionamiento mental y en el comportamiento, y puede causar muchos efectos fsicos que pueden ser perjudiciales.  La intoxicacin alcohlica puede variar en gravedad desde leve hasta muy grave. Hay varios factores que pueden afectar el nivel de intoxicacin que se produce, como la edad de la persona, el sexo, el peso, la frecuencia de consumo de alcohol, y la presencia de otras enfermedades mdicas (como diabetes, convulsiones o enfermedades del corazn). Los niveles peligrosos de intoxicacin por alcohol pueden ocurrir [employer] personas beben grandes cantidades de alcohol en un corto periodo de tiempo ([place]). El alcohol tambin puede ser especialmente peligroso cuando se combina con ciertos medicamentos recetados o drogas "recreativas". SIGNOS Y SNTOMAS Algunos de los signos y sntomas comunes de intoxicacin leve por alcohol incluyen:  Prdida de la coordinacin.  Cambios en el estado de  nimo  y Financial controller.  Incapacidad para razonar.  Hablar arrastrando las palabras. A medida que la intoxicacin por alcohol avanza a niveles ms graves, Zenaida Niece a Research officer, trade union otros signos y sntomas. Estos pueden ser:  Vmitos.  Confusin y alteracin de Scientist, clinical (histocompatibility and immunogenetics).  Disminucin de Engineer, manufacturing systems.  Convulsiones.  Prdida de la conciencia. DIAGNSTICO  El mdico le har una historia clnica y un examen fsico. Se le preguntar acerca de la cantidad y el tipo de alcohol que ha consumido. Se le realizarn anlisis de sangre para medir la concentracin de alcohol en sangre. En muchos lugares, el nivel de alcohol en la sangre debe ser inferior a 80 mg / dL (1,61%) para poder conducir legalmente. Sin embargo, hay muchos efectos peligrosos del alcohol que pueden ocurrir con niveles mucho ms bajos.  TRATAMIENTO  Las personas con intoxicacin por alcohol a menudo no requieren TEFL teacher. La mayor parte de los efectos de la intoxicacin por alcohol son temporales, y desaparecen a medida que el alcohol abandona el cuerpo de forma natural. El profesional controlar su estado hasta que est lo suficientemente estable como para volver a casa. A veces se administran lquidos por va intravenosa para ayudar a evitar la deshidratacin.  INSTRUCCIONES PARA EL CUIDADO EN EL HOGAR  No conduzca vehculos despus de beber alcohol.  Mantngase hidratado. Beba gran cantidad de lquido para mantener la orina de tono claro o color amarillo plido. Evite la cafena.   Tome slo medicamentos de venta libre o recetados, segn las indicaciones del mdico.  SOLICITE ATENCIN MDICA SI:   Tiene vmitos persistentes.   No mejora luego de [employer].  Se intoxica con alcohol con frecuencia. El mdico podr ayudarlo a decidir si debe consultar a un terapeuta especializado en el abuso de sustancias. SOLICITE ATENCIN MDICA DE INMEDIATO SI:   Se siente vacilante o tembloroso cuando trata de abandonar el  hbito.   Comienza a temblar de manera incontrolable (convulsiones).   Vomita sangre. Puede ser sangre de color rojo brillante o similar al sedimento del caf negro.   Maxie Better en la materia fecal. Puede ser de color rojo brillante o de aspecto alquitranado, con olor ftido.   Se siente mareado o se desmaya.  ASEGRESE DE QUE:   Comprende estas instrucciones.  Controlar su afeccin.  Recibir ayuda de inmediato si no mejora o si empeora. Document Released: 06/16/2005 Document Revised: 02/16/2013 [facility] Patient Information 2015 [place], [place]. This information is not intended to replace advice given to you by your health care provider. Make sure you discuss any questions you have with your health care provider.

## 2014-03-10 NOTE — ED Notes (Signed)
Per EMS: Pt found lying down on side of road, has been drinking alcohol. Pt states he drank beer and a pint of vodka, denies drug use. Hx of cirrhosis, c/o L side pain. Oriented to person and situation.

## 2014-03-10 NOTE — ED Notes (Signed)
Pt c/o upper L abdominal pain, pt unable to describe pain. Pt states he has been drinking alcohol today, unable to tell me how much he had to drink.

## 2014-03-10 NOTE — ED Notes (Signed)
Pt ambulated to bathroom with steady gait. 

## 2014-04-26 ENCOUNTER — Emergency Department (HOSPITAL_COMMUNITY)
Admission: EM | Admit: 2014-04-26 | Discharge: 2014-04-26 | Disposition: A | Discharge status: Home / Self Care | Payer: Self-pay | Attending: Emergency Medicine | Admitting: Emergency Medicine

## 2014-04-26 ENCOUNTER — Encounter (HOSPITAL_COMMUNITY): Payer: Self-pay | Admitting: Emergency Medicine

## 2014-04-26 ENCOUNTER — Emergency Department (HOSPITAL_COMMUNITY): Payer: Self-pay

## 2014-04-26 DIAGNOSIS — Y9285 Railroad track as the place of occurrence of the external cause: Secondary | ICD-10-CM | POA: Insufficient documentation

## 2014-04-26 DIAGNOSIS — L03116 Cellulitis of left lower limb: Secondary | ICD-10-CM | POA: Insufficient documentation

## 2014-04-26 DIAGNOSIS — R52 Pain, unspecified: Secondary | ICD-10-CM

## 2014-04-26 DIAGNOSIS — S99912A Unspecified injury of left ankle, initial encounter: Secondary | ICD-10-CM | POA: Insufficient documentation

## 2014-04-26 DIAGNOSIS — X58XXXA Exposure to other specified factors, initial encounter: Secondary | ICD-10-CM | POA: Insufficient documentation

## 2014-04-26 DIAGNOSIS — W19XXXA Unspecified fall, initial encounter: Secondary | ICD-10-CM

## 2014-04-26 DIAGNOSIS — Z720 Tobacco use: Secondary | ICD-10-CM | POA: Insufficient documentation

## 2014-04-26 DIAGNOSIS — Y9389 Activity, other specified: Secondary | ICD-10-CM | POA: Insufficient documentation

## 2014-04-26 DIAGNOSIS — Z8719 Personal history of other diseases of the digestive system: Secondary | ICD-10-CM | POA: Insufficient documentation

## 2014-04-26 LAB — CBC WITH DIFFERENTIAL/PLATELET
Basophils Absolute: 0 10*3/uL (ref 0.0–0.1)
Basophils Relative: 1 % (ref 0–1)
Eosinophils Absolute: 0 10*3/uL (ref 0.0–0.7)
Eosinophils Relative: 1 % (ref 0–5)
HEMATOCRIT: 40.7 % (ref 39.0–52.0)
HEMOGLOBIN: 13.6 g/dL (ref 13.0–17.0)
LYMPHS PCT: 30 % (ref 12–46)
Lymphs Abs: 1.6 10*3/uL (ref 0.7–4.0)
MCH: 31.4 pg (ref 26.0–34.0)
MCHC: 33.4 g/dL (ref 30.0–36.0)
MCV: 94 fL (ref 78.0–100.0)
Monocytes Absolute: 0.2 10*3/uL (ref 0.1–1.0)
Monocytes Relative: 4 % (ref 3–12)
NEUTROS ABS: 3.3 10*3/uL (ref 1.7–7.7)
Neutrophils Relative %: 64 % (ref 43–77)
Platelets: 221 10*3/uL (ref 150–400)
RBC: 4.33 MIL/uL (ref 4.22–5.81)
RDW: 12.9 % (ref 11.5–15.5)
WBC: 5.2 10*3/uL (ref 4.0–10.5)

## 2014-04-26 LAB — COMPREHENSIVE METABOLIC PANEL
ALK PHOS: 125 U/L — AB (ref 39–117)
ALT: 20 U/L (ref 0–53)
AST: 30 U/L (ref 0–37)
Albumin: 3.7 g/dL (ref 3.5–5.2)
Anion gap: 17 — ABNORMAL HIGH (ref 5–15)
BUN: 9 mg/dL (ref 6–23)
CHLORIDE: 102 meq/L (ref 96–112)
CO2: 22 meq/L (ref 19–32)
CREATININE: 0.65 mg/dL (ref 0.50–1.35)
Calcium: 8.3 mg/dL — ABNORMAL LOW (ref 8.4–10.5)
GFR calc Af Amer: >90 mL/min (ref >90)
Glucose, Bld: 92 mg/dL (ref 70–99)
Potassium: 3.8 mEq/L (ref 3.7–5.3)
Sodium: 141 mEq/L (ref 137–147)
Total Protein: 7.3 g/dL (ref 6.0–8.3)

## 2014-04-26 LAB — ETHANOL: Alcohol, Ethyl (B): 274 mg/dL — ABNORMAL HIGH (ref 0–11)

## 2014-04-26 LAB — I-STAT CG4 LACTIC ACID, ED: Lactic Acid, Venous: 1.85 mmol/L (ref 0.5–2.2)

## 2014-04-26 MED ORDER — ACETAMINOPHEN 325 MG PO TABS
650.0000 mg | ORAL_TABLET | Freq: Once | ORAL | Status: AC
  Administered 2014-04-26: 650 mg via ORAL
  Filled 2014-04-26: qty 2

## 2014-04-26 MED ORDER — SULFAMETHOXAZOLE-TRIMETHOPRIM 800-160 MG PO TABS: 1.0000 | ORAL_TABLET | Freq: Two times a day (BID) | ORAL | Status: AC

## 2014-04-26 MED ORDER — CEPHALEXIN 500 MG PO CAPS: ORAL_CAPSULE | ORAL | Status: AC

## 2014-04-26 NOTE — ED Provider Notes (Signed)
Patient reports he tripped and fell last night injuring his r foot and ankle Last night. Pain is worse with walking. He admits using alcohol last night. On exam patient is alert and nontoxic. Right lower extremity dorsum of foot with 2 cm abrasion. Skin is mildly diffusely reddened dorsum of foot. DP pulses 2+ good capillary refill area patient walks with a limp favoring left leg. Patient's last tetnus shot <5 years ago  Doug SouSam Aubry Tucholski, MD 04/26/14 1510

## 2014-04-26 NOTE — Discharge Instructions (Signed)
Celulitis °(Cellulitis) °La celulitis es una infección de la piel y del tejido subyacente. El área infectada generalmente está de color rojo y duele. Ocurre con más frecuencia en los brazos y en las piernas.  °CAUSAS  °La causa es una bacteria que ingresa en la piel a través de grietas o cortes. Los tipos más frecuentes de bacterias que causan celulitis son los estafilococos y los estreptococos. °SIGNOS Y SÍNTOMAS  °· Enrojecimiento y calor. °· Hinchazón. °· Sensibilidad o dolor. °· Fiebre. °DIAGNÓSTICO  °Por lo general, el médico puede diagnosticar esta afección con un examen físico. Es posible que le indiquen análisis de sangre. °TRATAMIENTO  °El tratamiento consiste en tomar antibióticos. °INSTRUCCIONES PARA EL CUIDADO EN EL HOGAR   °· Tome los antibióticos como le indicó el médico. Termine los antibióticos aunque comience a sentirse mejor. °· Mantenga el brazo o la pierna elevados para reducir la hinchazón. °· Aplique paños calientes en la zona hasta 4 veces al día para calmar el dolor. °· Tome los medicamentos solamente como se lo haya indicado el médico. °· Concurra a todas las visitas de control como se lo haya indicado el médico. °SOLICITE ATENCIÓN MÉDICA SI:  °· Observa una línea roja en la piel que sale desde la zona infectada. °· El área roja se extiende o se vuelve de color oscuro. °· El hueso o la articulación que se encuentran por debajo de la zona infectada le duelen después de que la piel se cura. °· La infección se repite en la misma zona o en una zona diferente. °· Tiene un bulto inflamado en la zona infectada. °· Presenta nuevos síntomas. °· Tiene fiebre. °SOLICITE ATENCIÓN MÉDICA DE INMEDIATO SI:  °· Se siente muy somnoliento. °· Tiene vómitos o diarrea. °· Se siente enfermo (malestar general) con dolores musculares. °ASEGÚRESE DE QUE:  °· Comprende estas instrucciones. °· Controlará su afección. °· Recibirá ayuda de inmediato si no mejora o si empeora. °Document Released: 03/26/2005 Document  Revised: 10/31/2013 °ExitCare® Patient Information ©2015 [employer]. This information is not intended to replace advice given to you by your health care provider. Make sure you discuss any questions you have with your health care provider. ° °

## 2014-04-26 NOTE — ED Notes (Signed)
Pt given Mal[place] sandwich and water to drink; pt requesting to leave. Informed pt of xrays ordered; pt reports "that'll cost me too much money." Pt requesting a bus pass and to leave AMA

## 2014-04-26 NOTE — ED Provider Notes (Signed)
CSN: [id number]     Arrival date & time 04/26/14  1213 History   First MD Initiated Contact with Patient 04/26/14 1252     Chief Complaint  Patient presents with  . Foot Pain     (Consider location/radiation/quality/duration/timing/severity/associated sxs/prior Treatment) HPI Comments: Patient is a 48 year old male with history of seizures and liver cirrhosis who presents to the emergency department today for evaluation of left foot pain. He reports that this began last night after he twisted his ankle. He reports he was trying to cross the railroad tracks and missed his step. No other injuries. Patient does admit to drinking 2 Busch lights today. He has severe pain in his left ankle. He reports numbness to his toes. Patient is a poor historian, changing his story throughout the interview.  Patient is a 48 y.o. male presenting with lower extremity pain. The history is provided by the patient. No language interpreter was used.  Foot Pain Associated symptoms include arthralgias, diaphoresis and joint swelling. Pertinent negatives include no chest pain, chills or fever.    Past Medical History  Diagnosis Date  . Seizures     pt is poor historian but states thhat he has hx seizures  . Cirrhosis of liver    Past Surgical History  Procedure Laterality Date  . Orif ankle fracture Left 04/22/2013    Procedure: OPEN REDUCTION INTERNAL FIXATION (ORIF) ANKLE FRACTURE;  Surgeon: Shelda PalMatthew D Olin, MD;  Location: [facility];  Service: Orthopedics;  Laterality: Left;   History reviewed. No pertinent family history. History  Substance Use Topics  . Smoking status: Current Every Day Smoker    Types: Cigarettes  . Smokeless tobacco: Never Used  . Alcohol Use: Yes    Review of Systems  Constitutional: Positive for diaphoresis. Negative for fever and chills.  Respiratory: Negative for shortness of breath.   Cardiovascular: Negative for chest pain.  Musculoskeletal: Positive for arthralgias, gait  problem and joint swelling.  Skin: Positive for color change.  All other systems reviewed and are negative.     Allergies  Review of patient's allergies indicates no known allergies.  Home Medications   Prior to Admission medications   Not on File   BP 95/63  Pulse 70  Temp(Src) 98.9 F (37.2 C) (Oral)  Resp 16  Ht 5\' 5"  (1.651 m)  Wt 150 lb (68.04 kg)  BMI 24.96 kg/m2  SpO2 95% Physical Exam  Nursing note and vitals reviewed. Constitutional: He is oriented to person, place, and time. He appears well-developed and well-nourished. No distress.  HENT:  Head: Normocephalic and atraumatic.  Right Ear: External ear normal.  Left Ear: External ear normal.  Nose: Nose normal.  Eyes: Conjunctivae are normal.  Neck: Normal range of motion. No tracheal deviation present.  Cardiovascular: Normal rate, regular rhythm, normal heart sounds, intact distal pulses and normal pulses.   Pulses:      Dorsalis pedis pulses are 2+ on the left side.       Posterior tibial pulses are 2+ on the left side.  Plan refill less than 3 seconds in all toes  Pulmonary/Chest: Effort normal and breath sounds normal. No stridor.  Abdominal: Soft. He exhibits no distension. There is no tenderness.  Musculoskeletal: Normal range of motion.  Exquisitely tender to palpation throughout left ankle Patient reports decreased sensation to toes. Bright red erythema to distal left foot  Neurological: He is alert and oriented to person, place, and time.  Skin: Skin is warm and dry. He is  not diaphoretic.  Psychiatric: He has a normal mood and affect. His behavior is normal.    ED Course  Procedures (including critical care time) Labs Review Labs Reviewed  COMPREHENSIVE METABOLIC PANEL - Abnormal; Notable for the following:    Calcium 8.3 (*)    Alkaline Phosphatase 125 (*)    Total Bilirubin <0.2 (*)    Anion gap 17 (*)    All other components within normal limits  ETHANOL - Abnormal; Notable for the  following:    Alcohol, Ethyl (B) 274 (*)    All other components within normal limits  CBC WITH DIFFERENTIAL  I-STAT CG4 LACTIC ACID, ED    Imaging Review No results found.   EKG Interpretation None      MDM   Final diagnoses:  Fall  Pain  Cellulitis of left lower extremity    Patient presents to emergency department for evaluation of left foot pain. Patient with cellulitis to left foot. Neurovascularly intact, compartments soft. Imaging is pending at this time. Patient will be discharged with abx and ASO brace if XR is negative. Patient signed out to Re[place]rowning, [place]-C at change of shift. Dr. Ethelda ChickJacubowitz evaluated patient and agrees with plan. Patient / Family / Caregiver informed of clinical course, understand medical decision-making process, and agree with plan.     Mora BellmanHannah S Renia Mikelson, PA-C 04/26/14 1622  Mora BellmanHannah S Levi Crass, PA-C 04/26/14 1623

## 2014-04-26 NOTE — ED Notes (Signed)
Patient transported to X-ray 

## 2014-04-26 NOTE — ED Notes (Signed)
Per pt sts he can barely walk on his left foot. Pt foot very red, some swelling and sts cant move his toes.

## 2014-04-26 NOTE — ED Provider Notes (Signed)
Medical screening examination/treatment/procedure(s) were conducted as a shared visit with non-physician practitioner(s) and myself.  I personally evaluated the patient during the encounter.   EKG Interpretation None       Glyndon Tursi, MD 04/26/14 1647 

## 2014-04-26 NOTE — ED Notes (Signed)
Pt does not want to wear cardiac monitor and has pulled it off. BP recycled

## 2014-07-09 ENCOUNTER — Encounter (HOSPITAL_COMMUNITY): Payer: Self-pay | Admitting: Emergency Medicine

## 2014-07-09 ENCOUNTER — Emergency Department (HOSPITAL_COMMUNITY)
Admission: EM | Admit: 2014-07-09 | Discharge: 2014-07-10 | Disposition: A | Discharge status: Home / Self Care | Payer: Self-pay | Attending: Emergency Medicine | Admitting: Emergency Medicine

## 2014-07-09 ENCOUNTER — Emergency Department (HOSPITAL_COMMUNITY): Payer: Self-pay

## 2014-07-09 DIAGNOSIS — F10120 Alcohol abuse with intoxication, uncomplicated: Secondary | ICD-10-CM | POA: Insufficient documentation

## 2014-07-09 DIAGNOSIS — Z720 Tobacco use: Secondary | ICD-10-CM | POA: Insufficient documentation

## 2014-07-09 DIAGNOSIS — F10920 Alcohol use, unspecified with intoxication, uncomplicated: Secondary | ICD-10-CM

## 2014-07-09 DIAGNOSIS — Z8719 Personal history of other diseases of the digestive system: Secondary | ICD-10-CM | POA: Insufficient documentation

## 2014-07-09 DIAGNOSIS — R1084 Generalized abdominal pain: Secondary | ICD-10-CM | POA: Insufficient documentation

## 2014-07-09 DIAGNOSIS — R4182 Altered mental status, unspecified: Secondary | ICD-10-CM

## 2014-07-09 DIAGNOSIS — Z8669 Personal history of other diseases of the nervous system and sense organs: Secondary | ICD-10-CM | POA: Insufficient documentation

## 2014-07-09 LAB — CBC WITH DIFFERENTIAL/PLATELET
Basophils Absolute: 0 10*3/uL (ref 0.0–0.1)
Basophils Relative: 1 % (ref 0–1)
Eosinophils Absolute: 0 10*3/uL (ref 0.0–0.7)
Eosinophils Relative: 1 % (ref 0–5)
HCT: 39 % (ref 39.0–52.0)
Hemoglobin: 13.3 g/dL (ref 13.0–17.0)
Lymphocytes Relative: 33 % (ref 12–46)
Lymphs Abs: 1.8 10*3/uL (ref 0.7–4.0)
MCH: 31.5 pg (ref 26.0–34.0)
MCHC: 34.1 g/dL (ref 30.0–36.0)
MCV: 92.4 fL (ref 78.0–100.0)
Monocytes Absolute: 0.3 10*3/uL (ref 0.1–1.0)
Monocytes Relative: 5 % (ref 3–12)
Neutro Abs: 3.2 10*3/uL (ref 1.7–7.7)
Neutrophils Relative %: 60 % (ref 43–77)
Platelets: 264 10*3/uL (ref 150–400)
RBC: 4.22 MIL/uL (ref 4.22–5.81)
RDW: 13.3 % (ref 11.5–15.5)
WBC: 5.4 10*3/uL (ref 4.0–10.5)

## 2014-07-09 LAB — COMPREHENSIVE METABOLIC PANEL
ALT: 22 U/L (ref 0–53)
AST: 34 U/L (ref 0–37)
Albumin: 4.2 g/dL (ref 3.5–5.2)
Alkaline Phosphatase: 91 U/L (ref 39–117)
Anion gap: 8 (ref 5–15)
BUN: 9 mg/dL (ref 6–23)
CO2: 21 mmol/L (ref 19–32)
Calcium: 7.8 mg/dL — ABNORMAL LOW (ref 8.4–10.5)
Chloride: 100 mEq/L (ref 96–112)
Creatinine, Ser: 0.55 mg/dL (ref 0.50–1.35)
GFR calc Af Amer: >90 mL/min (ref >90)
GFR calc non Af Amer: >90 mL/min (ref >90)
Glucose, Bld: 96 mg/dL (ref 70–99)
Potassium: 3.3 mmol/L — ABNORMAL LOW (ref 3.5–5.1)
Sodium: 129 mmol/L — ABNORMAL LOW (ref 135–145)
Total Bilirubin: 0.7 mg/dL (ref 0.3–1.2)
Total Protein: 7.2 g/dL (ref 6.0–8.3)

## 2014-07-09 LAB — ETHANOL: Alcohol, Ethyl (B): 417 mg/dL (ref 0–9)

## 2014-07-09 LAB — CBG MONITORING, ED: Glucose-Capillary: 100 mg/dL — ABNORMAL HIGH (ref 70–99)

## 2014-07-09 LAB — RAPID URINE DRUG SCREEN, HOSP PERFORMED
Amphetamines: NOT DETECTED
Barbiturates: NOT DETECTED
Benzodiazepines: NOT DETECTED
Cocaine: NOT DETECTED
Opiates: NOT DETECTED
Tetrahydrocannabinol: NOT DETECTED

## 2014-07-09 LAB — LIPASE, BLOOD: Lipase: 25 U/L (ref 11–59)

## 2014-07-09 MED ORDER — THIAMINE HCL 100 MG/ML IJ SOLN
100.0000 mg | Freq: Once | INTRAMUSCULAR | Status: AC
  Administered 2014-07-09: 100 mg via INTRAVENOUS
  Filled 2014-07-09: qty 2

## 2014-07-09 MED ORDER — SODIUM CHLORIDE 0.9 % IV BOLUS (SEPSIS)
1000.0000 mL | Freq: Once | INTRAVENOUS | Status: AC
  Administered 2014-07-09: 1000 mL via INTRAVENOUS

## 2014-07-09 NOTE — ED Provider Notes (Signed)
CSN: [id number]     Arrival date & time 07/09/14  2056 History   First MD Initiated Contact with Patient 07/09/14 2113     Chief Complaint  Patient presents with  . Alcohol Intoxication   LEVEL 5 CAVEAT: pt clinically intoxicated  (Consider location/radiation/quality/duration/timing/severity/associated sxs/prior Treatment) HPI Pt is a 49yo male brought to ED via EMS.  Per triage note, pt was found by bystander laying in the middle of the road.  Per EMS, pt was walking down side of the road, stated he was [facility] way [facility].  Pt c/o abdominal pain and nausea, has been dry heaving.  Reports drinking 2 beers today, smells strongly of etoh, gait unsteady, oriented to person, place and situation.  Pt limited with hx as he mumbles short answers then quickly falls back asleep. Requesting pain medication for abdomen. Denies drinking or drug use.  Denies head, neck or back pain.   Past Medical History  Diagnosis Date  . Seizures     pt is poor historian but states thhat he has hx seizures  . Cirrhosis of liver    Past Surgical History  Procedure Laterality Date  . Orif ankle fracture Left 04/22/2013    Procedure: OPEN REDUCTION INTERNAL FIXATION (ORIF) ANKLE FRACTURE;  Surgeon: Shelda PalMatthew D Olin, MD;  Location: [facility];  Service: Orthopedics;  Laterality: Left;   No family history on file. History  Substance Use Topics  . Smoking status: Current Every Day Smoker    Types: Cigarettes  . Smokeless tobacco: Never Used  . Alcohol Use: Yes    Review of Systems  Unable to perform ROS: Mental status change      Allergies  Review of patient's allergies indicates no known allergies.  Home Medications   Prior to Admission medications   Medication Sig Start Date End Date Taking? Authorizing Provider  cephALEXin (KEFLEX) 500 MG capsule 2 caps po bid x 7 days 04/26/14   Mora BellmanHannah S Merrell, PA-C  sulfamethoxazole-trimethoprim (SEPTRA DS) 800-160 MG per tablet Take 1 tablet by mouth every 12  (twelve) hours. 04/26/14   Ramon DredgeHannah S Merrell, PA-C   BP 101/61 mmHg  Pulse 75  Temp(Src) 98.4 F (36.9 C) (Oral)  Resp 18  SpO2 97% Physical Exam  Constitutional:  Unkempt, disheveled appearing male lying in exam bed. Strong smell of Etoh  HENT:  Head: Normocephalic and atraumatic.  Mouth/Throat: Mucous membranes are dry.  Dried lips  Eyes: Conjunctivae are normal. No scleral icterus.  Neck: Normal range of motion.  Cardiovascular: Normal rate, regular rhythm and normal heart sounds.   Pulmonary/Chest: Effort normal and breath sounds normal. No respiratory distress. He has no wheezes. He has no rales. He exhibits no tenderness.  Abdominal: Soft. Bowel sounds are normal. He exhibits no distension and no mass. There is tenderness. There is guarding. There is no rebound.  Soft, non-distended, diffuse tenderness with guarding. No masses palpated.  Musculoskeletal: Normal range of motion.  Neurological:  Pt responds to painful stimuli and repeated questioning. Will wake to mumble short answers. Alert to self and date of birth.  Falls back asleep repeatedly.   Skin: Skin is warm and dry.  Nursing note and vitals reviewed.   ED Course  Procedures (including critical care time) Labs Review Labs Reviewed  COMPREHENSIVE METABOLIC PANEL - Abnormal; Notable for the following:    Sodium 129 (*)    Potassium 3.3 (*)    Calcium 7.8 (*)    All other components within normal limits  ETHANOL - Abnormal; Notable for the following:    Alcohol, Ethyl (B) 417 (*)    All other components within normal limits  CBG MONITORING, ED - Abnormal; Notable for the following:    Glucose-Capillary 100 (*)    All other components within normal limits  CBC WITH DIFFERENTIAL  LIPASE, BLOOD  URINE RAPID DRUG SCREEN (HOSP PERFORMED)    Imaging Review Ct Head Wo Contrast  07/09/2014   CLINICAL DATA:  Found by a side of road, intoxication. Unsteady gait.  EXAM: CT HEAD WITHOUT CONTRAST  TECHNIQUE: Contiguous  axial images were obtained from the base of the skull through the vertex without intravenous contrast.  COMPARISON:  CT of the head September 11, 2013  FINDINGS: Mild ventriculomegaly, likely on the basis of global parenchymal brain volume loss as there is overall commensurate enlargement of cerebral sulci and cerebellar folia. Small area of LEFT frontal encephalomalacia again noted. No intraparenchymal hemorrhage, mass effect nor midline shift. No acute large vascular territory infarcts.  No abnormal extra-axial fluid collections. Basal cisterns are patent.  No skull fracture. Mild RIGHT frontal scalp soft tissue swelling was present on prior examination. The included ocular globes and orbital contents are non-suspicious. The mastoid aircells and included paranasal sinuses are well-aerated. Bilateral anterior maxillary sinus ORIF. Remote nasal bone fractures. 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  IMPRESSION: No acute intracranial process.  Mild global parenchymal brain volume loss for age.  Small focus of LEFT frontal encephalomalacia may be posttraumatic.   Electronically Signed   By: Awilda Metro   On: 07/09/2014 22:37     EKG Interpretation None      MDM   Final diagnoses:  Altered mental status  Alcohol intoxication, uncomplicated    Pt is a 49yo male with hx of alcohol abuse brought to ED via EMS tonight with AMS secondary to alcohol intoxication.   CT head performed as pt clinically intoxicated, unknown if pt fell.   Etoh: 417, labs otherwise unremarkable. CT head: unremarkable.   Pt given IV fluids and thiamine.  Pt drowsy, slurred speech, will only stay awake briefly, mumbles.  Plan is to observe pt in ED.  Give IV fluids.  Pt may be discharged home once clinically sober.    1:17 AM Pt signed out to Sharen Hones, NP and Dr. Read Drivers who will continue monitoring pt in ED.  Likely discharge home.        Junius Finner, PA-C 07/10/14 0118  Junius Finner, PA-C 07/10/14 0118  Hanley Seamen, MD 07/10/14 [phone]

## 2014-07-09 NOTE — ED Notes (Signed)
Bed: [id number] Expected date: 07/09/14 Expected time: 8:44 PM Means of arrival: Ambulance Comments: 49 yo M  Found in street, ETOH, AMS

## 2014-07-09 NOTE — ED Notes (Signed)
Per EMS: Pt found by bystander laying in the middle of the road. When EMS arrived, pt was walking down side of the road, stated [facility]. Pt c/o abdominal pain and nausea. Pt has been dry heaving. Pt reports drinking 2 beers today, smells strongly of ETOH. Unsteady gait. Oriented to person, place and situation.

## 2014-07-10 MED ORDER — SODIUM CHLORIDE 0.9 % IV BOLUS (SEPSIS)
1000.0000 mL | Freq: Once | INTRAVENOUS | Status: AC
  Administered 2014-07-10: 1000 mL via INTRAVENOUS

## 2014-07-10 NOTE — ED Provider Notes (Addendum)
6:33 AM Patient awake and alert in NAD. Able to converse normally. He states he is ready to go home. He is requesting breakfast.   Hanley SeamenJohn L Berklee Battey, MD 07/10/14 [id number]  Hanley SeamenJohn L Yentl Verge, MD 07/10/14 [phone]

## 2014-07-10 NOTE — Discharge Instructions (Signed)
°Emergency Department Resource Guide °1) Find a Doctor and Pay Out of Pocket °Although you won't have to find out who is covered by your insurance plan, it is a good idea to ask around and get recommendations. You will then need to call the office and see if the doctor you have chosen will accept you as a new patient and what types of options they offer for patients who are self-pay. Some doctors offer discounts or will set up payment plans for their patients who do not have insurance, but you will need to ask so you aren't surprised when you get to your appointment. ° °2) Contact Your Local Health Department °Not all health departments have doctors that can see patients for sick visits, but many do, so it is worth a call to see if yours does. If you don't know where your local health department is, you can check in your phone book. The CDC also has a tool to help you locate your state's health department, and many state websites also have listings of all of their local health departments. ° °3) Find a Walk-in Clinic °If your illness is not likely to be very severe or complicated, you may want to try a walk in clinic. These are popping up all over the country in pharmacies, drugstores, and shopping centers. They're usually staffed by nurse practitioners or physician assistants that have been trained to treat common illnesses and complaints. They're usually fairly quick and inexpensive. However, if you have serious medical issues or chronic medical problems, these are probably not your best option. ° °No Primary Care Doctor: °- Call Health Connect at  832-8000 - they can help you locate a primary care doctor that  accepts your insurance, provides certain services, etc. °- Physician Referral Service- 1-800-533-3463 ° °Chronic Pain Problems: °Organization         Address  Phone   Notes  °[facility]  (336) 297-2271 Patients need to be referred by their primary care doctor.  ° °Medication  Assistance: °Organization         Address  Phone   Notes  °[place] [facility] [address] °[place], [place] [address] (336) 641-8030 --Must be a resident of [place] °-- Must have NO insurance coverage whatsoever (no Medicaid/ Medicare, etc.) °-- The pt. MUST have a primary care doctor that directs their care regularly and follows them in the community °  °[facility]  (866) 331-1348   °[employer]  (888) 892-1162   ° °Agencies that provide inexpensive medical care: °Organization         Address  Phone   Notes  °[facility]  (336) 832-8035   °[facility]    (336) 832-7272   °[facility] [address] °[place], [place] [address] (336) 832-4777   °[facility] [address], °[place] (336) 271-4999   °[facility]    (336) 373-0678   °[facility]    (336) 272-1050   °[facility] ° [address], [place] Phone:  (336) 832-4444, Fax:  (336) 832-4440 Hours of Operation:  9 am - 6 pm, M-F.  Also accepts Medicaid/Medicare and self-pay.  °[facility] ° [address], [place] Phone: (336) 832-3150, Fax: (336) 832-3151. Hours of Operation:  8:30 am - 5:30 pm, M-F.  Also accepts Medicaid and self-pay.  °[facility] [place] [address], [place] Phone: (336) 878-6027   °[facility] [address], [place], [place] (336)723-1848, Ext. 123 Mondays & Thursdays: 7-9 AM.  First 15 patients are seen on a first come, first serve basis. °  ° °Medicaid-accepting [place] Providers: ° °Organization         Address  Phone   Notes  °[facility] [address], [place] (336) 641-2100 Also accepts self-pay patients.  °[facility] [address], [place] ° (336) 856-9996   °[facility] [address], [place]  (336) 288-8857   °[facility] [address] [place] [address], [place] (336) 299-7000   °Veita Bland [address], [place]  ° (336) 373-1557 Only accepts [place] Access Medicaid patients after they have their name applied to their card.  ° °Self-Pay (no insurance) in [place]: ° °Organization         Address  Phone   Notes  °Sickle Cell Patients, [facility] [address], [place] (336) 832-1970   °[facility] [address], [place] (336) 832-4400   °[facility] ° [address], [place] (336) 992-4800   °[facility]/Dr. Osei-Bonsu ° [address] [place] [address], [place] or [address], [place] (336) 841-8500 Phone number for both [place] and [place] locations is the same.  °[facility] [address], [place] (336) 299-0000   °[facility] [address] [place] [address], [place] or [address] (336) 852-7530 °(336) 878-2260   °[facility] [address], [place] (336) 350-1642, phone; (336) 294-5005, fax Sees patients 1st and 3rd Saturday of every month.  Must not qualify for public or private insurance (i.e. Medicaid, Medicare, [place] Health Choice, Veterans' Benefits) • Household income should be no more than 200% of the poverty level •The clinic cannot treat you if you are pregnant or think you are pregnant • Sexually transmitted diseases are not treated at the clinic.  ° ° °Dental Care: °Organization         Address  Phone  Notes  °[place] [facility] [address], [place] (336) 641-6152 Accepts children up to age 21 who are enrolled in Medicaid or [place] Health Choice; pregnant women with a Medicaid card; and children who have applied for Medicaid or [place] Health Choice, but were declined, whose parents can pay a reduced fee at time of service.  °[place]  [facility] [place]  [address], [place] (336) 641-7733 Accepts children up to age 21 who are enrolled in Medicaid or [place] Health Choice; pregnant women with a Medicaid card; and children who have applied for Medicaid or [place] Health Choice, but were declined, whose parents can pay a reduced fee at time of service.  °[facility] ° [address], [place] (336) 641-4533 Patients are seen by appointment only. Walk-ins are not accepted. [facility] will see patients 18 years of age and older. °Monday - Tuesday (8am-5pm) °Most Wednesdays (8:30-5pm) °$30 per visit, cash only  °[facility] ° [address], [place] (336) 641-4533 Patients are seen by appointment only. Walk-ins are not accepted. [facility] will see patients 18 years of age and older. °One   Wednesday Evening (Monthly: Volunteer Based).  $30 per visit, cash only  °[facility]  (919) 537-3737 for adults; Children under age 4, call Graduate Pediatric Dentistry at (919) [phone]. Children aged 4-14, please call (919) [phone] to request a pediatric application. ° Dental services are provided in all areas of dental care including fillings, crowns and bridges, complete and partial dentures, implants, gum treatment, root canals, and extractions. Preventive care is also provided. Treatment is provided to both adults and children. °Patients are selected via a lottery and there is often a waiting list. °  °[facility] [address], °[place] ° (336) 763-8833 [email or web address] °  °[facility] [address], [place], [place] (336)723-1848, Ext. 123 Second and Fourth Thursday of each month, opens at 6:30 AM; Clinic ends at 9 AM.  Patients are seen on a first-come first-served basis, and a limited number are seen during each clinic.  ° °[facility] ° [address], [place], [place] (336) 723-7904    Eligibility Requirements °You must have lived in [place], [place], or [place] counties for at least the last three months. °  You cannot be eligible for state or federal sponsored healthcare insurance, including Veterans Administration, Medicaid, or Medicare. °  You generally cannot be eligible for healthcare insurance through your employer.  °  How to apply: °Eligibility screenings are held every Tuesday and Wednesday afternoon from 1:00 pm until 4:00 pm. You do not need an appointment for the interview!  °[facility] [address], [place], [place] 336-631-2330   °[place] [facility]  336-342-8273   °[place]  336-703-3100   °[facility]  336-570-6415   ° °Behavioral Health Resources in the Community: °Intensive Outpatient Programs °Organization         Address  Phone  Notes  °[facility] [address], [place], [place] 336-878-6098   °[facility] [address], [place], [place] 336-832-9800   °[facility] [address], [place], [place] ° 336-882-2125   °[facility] [address],  °[place], [place] 1-800-853-5163 or 336-641-4981   °Substance Abuse Resources °Organization         Address  Phone  Notes  °[facility]  336-882-2125   °[facility]  336-784-9470   °[facility]  336-285-9073   °[facility]  336-845-3988   °Residential & Outpatient Substance Abuse Program  1-800-659-3381   °Psychological Services °Organization         Address  Phone  Notes  ° Health  336- 832-9600   °[facility]  336- 378-7881   °[facility] [address], [place] 1-800-853-5163 or 336-641-4981   ° °Mobile Crisis Teams °Organization         Address  Phone  Notes  °[facility], Mobile Crisis Care Unit  1-877-626-1772   °Assertive °[facility] ° [address]  [place], [place] 336-834-9664   °Sharon DeEsch [address] °[place] 336-554-5454   ° °Self-Help/Support Groups °Organization         Address  Phone             Notes  °[facility]  - variety of support groups  336- 373-1402 Call for more information  °Narcotics Anonymous (NA), [facility] [address], °[place]  2 meetings at this location  ° °  Residential Treatment Programs °Organization         Address  Phone  Notes  °[facility] [address],    °[place]  1-866-801-8205   °[facility] ° [address], [place], [place] 704-293-8524   °[facility] [address], [place] 336-845-3988 Admissions: 8am-3pm M-F  °[facility] [address]    °[place], [place] 336-841-1104   °[facility] [address], [place], [place] 336-379-7146   °[facility] [address]  °[place], [place] 336-285-9073   °[facility] Intensive Outpatient [address], [place], [place] 336-852-3033   °[facility] [address]  °[place], [place] 1-877-615-2722 or 336-784-9470   °[facility] [address] [place], [place] 336-227-7417 Accepts Medicaid  °[facility] [address]  °[place] 1-800-659-3381 Substance Abuse/Addiction Treatment  ° °[place] Behavioral Health Resources °Organization         Address  Phone  Notes  °[facility]  (888) 581-9988   °Julie Brannon, PhD [address] [place], [place]   (336) 349-5553 or (336) 951-0000   ° Behavioral   [address] °[place], [place] (336) 349-4454   °[facility] [address], [place], [place] (336) 342-8316 Insurance/Medicaid/sponsorship through [facility]  °[facility] [address]                                    [place], [place] (336) 342-8316 Therapy/tele-psych/case    °[facility] [address]  ° [place], [place] (336) 349-2233    °Dr. Arfeen  (336) 349-4544   °[facility] [place]  [employer] [place] [facility] 1) [address], [place] °2) [address], [place] °3)  [address], [place] (336) 349-3220 °(336) 342-7768 ° °(336) 342-8140   °[place] Child Abuse [facility] (336) 342-1394 or (336) 342-3537 (After Hours)    ° ° °

## 2014-11-04 ENCOUNTER — Encounter (HOSPITAL_COMMUNITY): Payer: Self-pay | Admitting: Emergency Medicine

## 2014-11-04 ENCOUNTER — Emergency Department (HOSPITAL_COMMUNITY)
Admission: EM | Admit: 2014-11-04 | Discharge: 2014-11-05 | Disposition: A | Discharge status: Home / Self Care | Payer: Self-pay | Attending: Emergency Medicine | Admitting: Emergency Medicine

## 2014-11-04 DIAGNOSIS — F10120 Alcohol abuse with intoxication, uncomplicated: Secondary | ICD-10-CM | POA: Insufficient documentation

## 2014-11-04 DIAGNOSIS — F1210 Cannabis abuse, uncomplicated: Secondary | ICD-10-CM | POA: Insufficient documentation

## 2014-11-04 DIAGNOSIS — Z8719 Personal history of other diseases of the digestive system: Secondary | ICD-10-CM | POA: Insufficient documentation

## 2014-11-04 DIAGNOSIS — F10920 Alcohol use, unspecified with intoxication, uncomplicated: Secondary | ICD-10-CM

## 2014-11-04 DIAGNOSIS — Z720 Tobacco use: Secondary | ICD-10-CM | POA: Insufficient documentation

## 2014-11-04 MED ORDER — THIAMINE HCL 100 MG/ML IJ SOLN
Freq: Once | INTRAVENOUS | Status: AC
  Administered 2014-11-05: 01:00:00 via INTRAVENOUS
  Filled 2014-11-04: qty 1000

## 2014-11-04 NOTE — ED Notes (Signed)
Pt from side of road  Via [facility]. Pt is intoxicated  And reports drinking a bottle of tequila. He c/o nausea  But denies vomiting. He c/o of right sided chest wall pain and painful upon palpation.

## 2014-11-05 LAB — COMPREHENSIVE METABOLIC PANEL
ALK PHOS: 97 U/L (ref 38–126)
ALT: 17 U/L (ref 17–63)
AST: 23 U/L (ref 15–41)
Albumin: 4 g/dL (ref 3.5–5.0)
Anion gap: 8 (ref 5–15)
BUN: 11 mg/dL (ref 6–20)
CO2: 24 mmol/L (ref 22–32)
Calcium: 8.1 mg/dL — ABNORMAL LOW (ref 8.9–10.3)
Chloride: 106 mmol/L (ref 101–111)
Creatinine, Ser: 0.62 mg/dL (ref 0.61–1.24)
GFR calc Af Amer: >60 mL/min (ref >60)
GFR calc non Af Amer: >60 mL/min (ref >60)
Glucose, Bld: 101 mg/dL — ABNORMAL HIGH (ref 70–99)
Potassium: 3.5 mmol/L (ref 3.5–5.1)
Sodium: 138 mmol/L (ref 135–145)
Total Bilirubin: 0.5 mg/dL (ref 0.3–1.2)
Total Protein: 7.4 g/dL (ref 6.5–8.1)

## 2014-11-05 LAB — CBC WITH DIFFERENTIAL/PLATELET
BASOS PCT: 1 % (ref 0–1)
Basophils Absolute: 0 10*3/uL (ref 0.0–0.1)
EOS ABS: 0.1 10*3/uL (ref 0.0–0.7)
Eosinophils Relative: 2 % (ref 0–5)
HEMATOCRIT: 39 % (ref 39.0–52.0)
Hemoglobin: 13.2 g/dL (ref 13.0–17.0)
Lymphocytes Relative: 35 % (ref 12–46)
Lymphs Abs: 1.9 10*3/uL (ref 0.7–4.0)
MCH: 31.5 pg (ref 26.0–34.0)
MCHC: 33.8 g/dL (ref 30.0–36.0)
MCV: 93.1 fL (ref 78.0–100.0)
MONO ABS: 0.2 10*3/uL (ref 0.1–1.0)
MONOS PCT: 4 % (ref 3–12)
Neutro Abs: 3.1 10*3/uL (ref 1.7–7.7)
Neutrophils Relative %: 58 % (ref 43–77)
Platelets: 259 10*3/uL (ref 150–400)
RBC: 4.19 MIL/uL — AB (ref 4.22–5.81)
RDW: 12.2 % (ref 11.5–15.5)
WBC: 5.4 10*3/uL (ref 4.0–10.5)

## 2014-11-05 LAB — ETHANOL: Alcohol, Ethyl (B): 313 mg/dL (ref <5)

## 2014-11-05 LAB — RAPID URINE DRUG SCREEN, HOSP PERFORMED
Amphetamines: NOT DETECTED
BARBITURATES: NOT DETECTED
BENZODIAZEPINES: NOT DETECTED
Cocaine: NOT DETECTED
Opiates: NOT DETECTED
Tetrahydrocannabinol: POSITIVE — AB

## 2014-11-05 MED ORDER — SODIUM CHLORIDE 0.9 % IV BOLUS (SEPSIS)
1000.0000 mL | INTRAVENOUS | Status: AC
  Administered 2014-11-05: 1000 mL via INTRAVENOUS

## 2014-11-05 NOTE — ED Provider Notes (Signed)
CSN: [id number]     Arrival date & time 11/04/14  2216 History   First MD Initiated Contact with Patient 11/04/14 2314     Chief Complaint  Patient presents with  . Alcohol Intoxication     (Consider location/radiation/quality/duration/timing/severity/associated sxs/prior Treatment) Patient is a 49 y.o. male presenting with intoxication. The history is provided by the EMS personnel. No language interpreter was used.  Alcohol Intoxication  Mr. Travis Fry is a 49 y.o male who presents for alcohol intoxication. He is unable to provide a history and is sleeping. He was found on the side of the road per EMS.  He told EMS he was drinking a bottle of tequilla.  He states he was nauseated but no vomiting.   Past Medical History  Diagnosis Date  . Seizures     pt is poor historian but states thhat he has hx seizures  . Cirrhosis of liver    Past Surgical History  Procedure Laterality Date  . Orif ankle fracture Left 04/22/2013    Procedure: OPEN REDUCTION INTERNAL FIXATION (ORIF) ANKLE FRACTURE;  Surgeon: Shelda PalMatthew D Olin, MD;  [facility]: [facility];  Service: Orthopedics;  Laterality: Left;   No family history on file. History  Substance Use Topics  . Smoking status: Current Every Day Smoker    Types: Cigarettes  . Smokeless tobacco: Never Used  . Alcohol Use: Yes    Review of Systems  Unable to perform ROS     Allergies  Review of patient's allergies indicates no known allergies.  Home Medications   Prior to Admission medications   Medication Sig Start Date End Date Taking? Authorizing Provider  cephALEXin (KEFLEX) 500 MG capsule 2 caps po bid x 7 days Patient not taking: Reported on 07/10/2014 04/26/14   Junious SilkHannah Merrell, PA-C  ibuprofen (ADVIL,MOTRIN) 200 MG tablet Take 600 mg by mouth every 6 (six) hours as needed for moderate pain.    Historical Provider, MD  sulfamethoxazole-trimethoprim (SEPTRA DS) 800-160 MG per tablet Take 1 tablet by mouth every 12 (twelve) hours. Patient not  taking: Reported on 07/10/2014 04/26/14   Junious SilkHannah Merrell, PA-C   BP 89/52 mmHg  Pulse 75  Temp(Src) 98.1 F (36.7 C) (Oral)  Resp 14  SpO2 94% Physical Exam  Constitutional: He appears well-developed and well-nourished.  HENT:  Head: Normocephalic and atraumatic.  Eyes: Right conjunctiva is injected. Left conjunctiva is injected.  Neck: Normal range of motion. Neck supple.  Cardiovascular: Normal rate, regular rhythm and normal heart sounds.   Pulmonary/Chest: Effort normal and breath sounds normal. No respiratory distress.  Abdominal: Soft. There is no tenderness.  Neurological:  Sleeping on exam.  Easily arousable but cannot answer questions.  Appears intoxicated.  Skin: Skin is warm and dry.  Nursing note and vitals reviewed.   ED Course  Procedures (including critical care time) Labs Review Labs Reviewed  CBC WITH DIFFERENTIAL/PLATELET  COMPREHENSIVE METABOLIC PANEL  ETHANOL  URINE RAPID DRUG SCREEN (HOSP PERFORMED)    Imaging Review No results found.   EKG Interpretation None      MDM   Final diagnoses:  Alcohol intoxication, uncomplicated  Patient presents for alcohol intoxication after being found on the side of the road by EMS.  He is unable to provide a history of what happened or if he is in pain.  He is sleeping but arousable. He mumbles but and falls back asleep.   He has been found on the side of the road previously for alcohol intoxication. He is hypotensive. I  have gotten labs on him and given him a banana bag.   I discussed this patient with Harle BattiestElizabeth Tysinger, PA who will follow the patient.  I discussed that the patient is still hypotensive and labs are pending.  He will need fluid resuscitation with repeated vitals. The plan is to discharge him once he is sober enough to walk and talk.       Catha GosselinHanna Patel-Mills, PA-C 11/05/14 0033  Tilden FossaElizabeth Rees, MD 11/05/14 1455

## 2014-11-05 NOTE — ED Provider Notes (Signed)
12:55 AM: At change of shift, hand-off report received from [facility] Patel-Mills, PA-C.  Plan includes evaluate for improvement of hypotension after IVF and discharge when ambulatory and conversant.  After review of EM, pt's baseline bp is low 100s systolic.  Pt is resting without distress currently.    3:00 AM Pt continues to rest without distress, arouses without difficulty. Pt receiving 2nd liter NS bolus.  6:00 AM: Pt ambulated around department without difficulty, conversant and requesting a bus pass. Pt is well-appearing, in no acute distress and vital signs reviewed and not concerning. He appears safe to be discharged.  Return precautions provided. Pt aware of plan and in agreement.    Filed Vitals:   11/05/14 0215 11/05/14 0300 11/05/14 0330 11/05/14 0420  BP: 86/50 88/51 89/47  93/59  Pulse: 63 66 65 72  Temp:      TempSrc:      Resp:    13  SpO2: 96% 96% 98% 91%   Meds given in ED:  Medications  sodium chloride 0.9 % 1,000 mL with thiamine 100 mg, folic acid 1 mg, multivitamins adult 10 mL infusion ( Intravenous Stopped 11/05/14 0609)  sodium chloride 0.9 % bolus 1,000 mL (0 mLs Intravenous Stopped 11/05/14 0359)    Discharge Medication List as of 11/05/2014  6:11 AM       Harle BattiestElizabeth Alejandro Gamel, NP 11/05/14 [phone]  Mirian MoMatthew Gentry, MD 11/08/14 [phone]

## 2014-11-05 NOTE — ED Notes (Signed)
Lab reports a Ethanol of 313

## 2014-11-05 NOTE — Discharge Instructions (Signed)
Please follow directions provided. Be sure to follow-up with your primary care doctor to ensure you're getting better. You may use the resource guide or the referral given to establish care with a primary care doctor. Sure to drink plenty of fluids to stay well hydrated and eat a well-balanced diet. Don't hesitate to return for any new, worsening, or concerning symptoms.   SEEK IMMEDIATE MEDICAL CARE IF:  You have a seizure.  You have a fever.  You experience uncontrolled vomiting or you vomit up blood. This may be bright red or look like black coffee grounds.  You have blood in the stool. This may be bright red or appear as a black, tarry, bad-smelling stool.  You become lightheaded or faint. Do not drive if you feel this way. Have someone else drive you or call [phone] for help.  You become more agitated or confused.  You develop uncontrolled anxiety.  You begin to see things that are not really there (hallucinate).   Emergency Department Resource Guide 1) Find a Doctor and Pay Out of Pocket Although you won't have to find out who is covered by your insurance plan, it is a good idea to ask around and get recommendations. You will then need to call the office and see if the doctor you have chosen will accept you as a new patient and what types of options they offer for patients who are self-pay. Some doctors offer discounts or will set up payment plans for their patients who do not have insurance, but you will need to ask so you aren't surprised when you get to your appointment.  2) Contact Your Local Health Department Not all health departments have doctors that can see patients for sick visits, but many do, so it is worth a call to see if yours does. If you don't know where your local health department is, you can check in your phone book. The CDC also has a tool to help you locate your state's health department, and many state websites also have listings of all of their local health  departments.  3) Find a Walk-in Clinic If your illness is not likely to be very severe or complicated, you may want to try a walk in clinic. These are popping up all over the country in pharmacies, drugstores, and shopping centers. They're usually staffed by nurse practitioners or physician assistants that have been trained to treat common illnesses and complaints. They're usually fairly quick and inexpensive. However, if you have serious medical issues or chronic medical problems, these are probably not your best option.  No Primary Care Doctor: - Call Health Connect at  [phone] - they can help you locate a primary care doctor that  accepts your insurance, provides certain services, etc. - Physician Referral Service- [phone]  Chronic Pain Problems: Organization         Address  Phone   Notes  [facility]  [phone] Patients need to be referred by their primary care doctor.   Medication Assistance: Organization         Address  Phone   Notes  [facility] [address] [place], [place] [phone] --Must be a resident of [place] -- Must have NO insurance coverage whatsoever (no Medicaid/ Medicare, etc.) -- The pt. MUST have a primary care doctor that directs their care regularly and follows them in the community   [facility]ist  [phone]   [address]  (  [phone]    Agencies that provide inexpensive medical care: Organization         Address  Phone   Notes  [place]  [phone]   [facility]    [phone]   [facility] [place], [place] [address] [phone]   [place] [address], [place] [phone]   Planned Parenthood    [phone]   [facility]    [phone]   [place] and [address], [place] Phone:  [phone], Fax:  [phone] Hours of Operation:  9 am - 6 pm, M-F.  Also accepts Medicaid/Medicare and self-pay.  [facility] for [address], [address], [place] Phone: [phone], Fax: [phone]. Hours of Operation:  8:30 am - 5:30 pm, M-F.  Also accepts Medicaid and self-pay.  [facility] [place] [address] [place] Phone: [phone]   [place], [place], [place] [phone], Ext. [phone] Mondays & Thursdays: 7-9 AM.  First 15 patients are seen on a first come, first serve basis.    [place] Providers:  Organization         Address  Phone   Notes  [facility] [address], [place] [phone] Also accepts self-pay patients.  [facility] [address], [place]  [phone]   [place], [address], [place] [phone]   [facility] [address] [place] [phone]   Lucianne Lei [address], [place]   [phone] Only accepts [place] Access [place] patients after they have their name applied to their card.   Self-Pay (no insurance) in [facility]:  Organization         Address  Phone   Notes  Sickle Cell Patients, [facility] [place] [phone]   [facility] [place] [phone]   [facility] [place]  [place], [place], [place] [phone]   [facility]/Dr. Osei-Bonsu  [address], [place] or [address], [place] [phone] Phone number for both [place] and [place] locations is the same.  [facility] [address] [place] [phone]   [facility] [address] [place] or [address] [phone]   [facility] [address], [place] [phone], phone; [phone], fax Sees patients 1st and 3rd Saturday of every month.  Must not qualify for public or private insurance (i.e. Medicaid, Medicare, [place] Health Choice, Veterans' Benefits)  Household income should be no more than 200% of the poverty level The clinic cannot treat you if you are pregnant or think you are pregnant  Sexually transmitted diseases are not treated at the clinic.    Dental Care: Organization         Address  Phone  Notes  [facility] Department of [facility] [place] [phone] Accepts children up to age 81 who are enrolled in [place] or [place]; pregnant women with a Medicaid card; and children who  have applied for Medicaid or [place] Health Choice, but were declined, whose parents can pay a reduced fee at time of service.  [facility] Department of [facility]  [address], [place] [phone] Accepts children up to age 78 who are enrolled in [place] or [place]; pregnant women with a Medicaid card; and children who have applied for Medicaid or [place] Health Choice, but were declined, whose parents can pay a reduced fee at time of service.  [place] Adult Dental Access PROGRAM  [place] [phone] Patients are seen by appointment only. Walk-ins are not accepted. [place] will see patients 101 years of age and older. Monday - Tuesday (8am-5pm) Most Wednesdays (8:30-5pm) $30 per visit, cash only  [facility] Adult Dental Access PROGRAM  [address], [facility] [phone] Patients are seen by appointment only. Walk-ins are not accepted. [place] will see patients 21 years of age and older. One Wednesday Evening (Monthly: Volunteer Based).  $30 per visit, cash only  [place]  [phone] for adults;  Children under age 26, call Graduate Pediatric Dentistry at [phone]. Children aged 36-14, please call [phone] to request a pediatric application.  Dental services are provided in all areas of dental care including fillings, crowns and bridges, complete and partial dentures, implants, gum treatment, root canals, and extractions. Preventive care is also provided. Treatment is provided to both adults and children. Patients are selected via a lottery and there is often a waiting list.   [facility] [address], [place]  [phone] [email or web address]   [facility] [address] [place], [place] [phone], Ext. [phone] Second and Fourth Thursday of each month, opens at 6:30 AM; Clinic ends at 9 AM.  Patients are seen on a first-come first-served basis, and a limited number are seen during each clinic.   [facility]  [address] Hillard Danker [place], [place] [phone]   Eligibility Requirements You must have lived in [place], [place], or [place] counties for at least the last three months.   You cannot be eligible for state or federal sponsored [employer], including [employer], [place], or [employer].   You generally cannot be eligible for healthcare insurance through your employer.    How to apply: Eligibility screenings are held every Tuesday and Wednesday afternoon from 1:00 pm until 4:00 pm. You do not need an appointment for the interview!  [facility] [address], [place], [place]   [place]  [place] Department  [place]  [phone]    Behavioral Health Resources in the Community: Intensive Outpatient Programs Organization         Address  Phone  Notes  [facility] [place]. [address] [place], [place] [phone]   [facility] Outpatient [address] [place], [place]   ADS: Alcohol & Drug Svcs [address], [place], [place]   [place] [address],  [place], [place] or [phone]   Substance Abuse Resources Organization         Address  Phone  Notes  Alcohol and Drug Services  [phone]   [facility]  [phone]   [facility] [place]   [facility]  [phone]   Residential & Outpatient  Substance Abuse Program  [phone]   Psychological Services Organization         Address  Phone  Notes  [place]  [place]  [phone]   [place] [address], [place] or [phone]    Mobile Crisis Teams Organization         Address  Phone  Notes  [facility], Mobile Crisis Care Unit  [phone]   [facility]  [address] [place], [place]   [place] Levels [address], [place] [phone]    Self-Help/Support Groups Organization         Address  Phone             Notes  [place]. of [place] - variety of support groups  [place] Call for more information  Narcotics Anonymous (NA), [facility] [address], [employer] [place]  2 meetings at this location   Special educational needs teacher         Address  Phone  Notes  ASAP Residential Treatment [place],    [place]  [phone]   [facility]  [address], [place] [address]70340, [place], [place]   [place], [place] [phone] Admissions: 8am-3pm M-F  Incentives Substance [place] [address],    [place], [place] [phone]   [facility] [address] [place], [place], [place]   [facility] [address],  [place], [place]   Insight Programs - Intensive  Outpatient [address], [place], [place]   [facility] ([place] [address]  [place], [place] [phone] or [phone]   [facility] (RTS) [address] [place], [place] Accepts Medicaid  [place] [address]  [place] [phone] Substance Abuse/Addiction Treatment   [facility] Organization         Address  Phone  Notes  [facility]  [phone]   Domenic Schwab, PhD [address] Arlis [place], [place]   [phone] or [phone]   [place] [employer] [place], [place] [phone]   [facility] [address] [place], [place] [phone] Insurance/Medicaid/sponsorship through [facility] and Families [address], [address]                                    [place], [place] [phone] [place] [address], [place] [phone]    Dr. Adele Schilder  [phone]   [facility] Dept. 1) [address] [place] 2) [place] 3)  [address], [place] [phone]  [phone]   [place] [phone] or [phone] (After Hours)

## 2015-01-08 ENCOUNTER — Encounter (HOSPITAL_COMMUNITY): Payer: Self-pay | Admitting: Emergency Medicine

## 2015-01-08 ENCOUNTER — Emergency Department (HOSPITAL_COMMUNITY): Payer: Self-pay

## 2015-01-08 ENCOUNTER — Emergency Department (HOSPITAL_COMMUNITY)
Admission: EM | Admit: 2015-01-08 | Discharge: 2015-01-09 | Disposition: A | Discharge status: Home / Self Care | Payer: Self-pay | Attending: Emergency Medicine | Admitting: Emergency Medicine

## 2015-01-08 DIAGNOSIS — F10920 Alcohol use, unspecified with intoxication, uncomplicated: Secondary | ICD-10-CM

## 2015-01-08 DIAGNOSIS — Z720 Tobacco use: Secondary | ICD-10-CM | POA: Insufficient documentation

## 2015-01-08 DIAGNOSIS — Z8719 Personal history of other diseases of the digestive system: Secondary | ICD-10-CM | POA: Insufficient documentation

## 2015-01-08 DIAGNOSIS — F10120 Alcohol abuse with intoxication, uncomplicated: Secondary | ICD-10-CM | POA: Insufficient documentation

## 2015-01-08 LAB — CBC
HCT: 38.8 % — ABNORMAL LOW (ref 39.0–52.0)
HEMOGLOBIN: 13.4 g/dL (ref 13.0–17.0)
MCH: 32.3 pg (ref 26.0–34.0)
MCHC: 34.5 g/dL (ref 30.0–36.0)
MCV: 93.5 fL (ref 78.0–100.0)
Platelets: 216 10*3/uL (ref 150–400)
RBC: 4.15 MIL/uL — ABNORMAL LOW (ref 4.22–5.81)
RDW: 12.4 % (ref 11.5–15.5)
WBC: 7 10*3/uL (ref 4.0–10.5)

## 2015-01-08 LAB — COMPREHENSIVE METABOLIC PANEL
ALT: 23 U/L (ref 17–63)
ANION GAP: 10 (ref 5–15)
AST: 30 U/L (ref 15–41)
Albumin: 3.7 g/dL (ref 3.5–5.0)
Alkaline Phosphatase: 77 U/L (ref 38–126)
BILIRUBIN TOTAL: 0.6 mg/dL (ref 0.3–1.2)
BUN: 9 mg/dL (ref 6–20)
CALCIUM: 8.1 mg/dL — AB (ref 8.9–10.3)
CO2: 22 mmol/L (ref 22–32)
Chloride: 106 mmol/L (ref 101–111)
Creatinine, Ser: 0.77 mg/dL (ref 0.61–1.24)
GFR calc non Af Amer: >60 mL/min (ref >60)
Glucose, Bld: 93 mg/dL (ref 65–99)
POTASSIUM: 3.7 mmol/L (ref 3.5–5.1)
Sodium: 138 mmol/L (ref 135–145)
Total Protein: 6.6 g/dL (ref 6.5–8.1)

## 2015-01-08 LAB — RAPID URINE DRUG SCREEN, HOSP PERFORMED
AMPHETAMINES: NOT DETECTED
Barbiturates: NOT DETECTED
Benzodiazepines: NOT DETECTED
Cocaine: NOT DETECTED
Opiates: NOT DETECTED
TETRAHYDROCANNABINOL: NOT DETECTED

## 2015-01-08 LAB — I-STAT CG4 LACTIC ACID, ED: Lactic Acid, Venous: 2.09 mmol/L (ref 0.5–2.0)

## 2015-01-08 LAB — CBG MONITORING, ED: GLUCOSE-CAPILLARY: 89 mg/dL (ref 65–99)

## 2015-01-08 LAB — ETHANOL: Alcohol, Ethyl (B): 471 mg/dL (ref <5)

## 2015-01-08 MED ORDER — SODIUM CHLORIDE 0.9 % IV BOLUS (SEPSIS)
1000.0000 mL | Freq: Once | INTRAVENOUS | Status: AC
  Administered 2015-01-08: 1000 mL via INTRAVENOUS

## 2015-01-08 NOTE — ED Notes (Signed)
Patient is resting comfortably. 

## 2015-01-08 NOTE — ED Notes (Signed)
Critical etoh 471, dr pickering aware.

## 2015-01-08 NOTE — ED Notes (Signed)
Pt removed c collar and etco2 md made aware and is okay with same
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
## 2015-01-08 NOTE — ED Notes (Signed)
Pt. Keeps getting out of bed and removing monitor cords,pulse ox, oxygen, and bp cuff. Went in room to find patient standing up urinating in the urinal. Placed pt back in bed, rails up, and covered pt with a warm blanket.

## 2015-01-09 ENCOUNTER — Encounter (HOSPITAL_COMMUNITY): Payer: Self-pay

## 2015-01-09 ENCOUNTER — Emergency Department (HOSPITAL_COMMUNITY): Payer: Self-pay

## 2015-01-09 LAB — I-STAT CG4 LACTIC ACID, ED
LACTIC ACID, VENOUS: 2.42 mmol/L — AB (ref 0.5–2.0)
Lactic Acid, Venous: 2.77 mmol/L (ref 0.5–2.0)

## 2015-01-09 LAB — LIPASE, BLOOD: LIPASE: 20 U/L — AB (ref 22–51)

## 2015-01-09 MED ORDER — SODIUM CHLORIDE 0.9 % IV SOLN
INTRAVENOUS | Status: DC | PRN
  Administered 2015-01-09: 1000 mL via INTRAVENOUS

## 2015-01-09 MED ORDER — IOHEXOL 300 MG/ML  SOLN
80.0000 mL | Freq: Once | INTRAMUSCULAR | Status: AC | PRN
  Administered 2015-01-09: 100 mL via INTRAVENOUS

## 2015-01-09 NOTE — ED Notes (Signed)
Dr Fritzi Mandeseister in to evaluate pt due to blood pressure consistently low.

## 2015-01-09 NOTE — ED Notes (Signed)
Pt attempted to ambulated, required assistance and was very shaky,

## 2015-01-09 NOTE — ED Notes (Addendum)
Given a Malaw[place]key sandwich and water. Pt keeps asking to take off pulse ox and explained that we had to monitor him. Pt states okay. Will place BP cuff back on pt when he finishes eating.

## 2015-01-09 NOTE — ED Notes (Signed)
Patient transported to CT 

## 2015-01-09 NOTE — Discharge Instructions (Signed)
Intoxicación alcohólica °(Alcohol Intoxication) °La intoxicación alcohólica ocurre cuando ha bebido la cantidad de alcohol suficiente para afectar su desenvolvimiento. Puede ser leve o muy grave. Beber gran cantidad de alcohol en un corto plazo se denomina borrachera. Puede ser muy nociva. Beber alcohol también puede ser muy peligroso si toma medicamentos o utiliza otras drogas. Algunos de los efectos causados por el alcohol son: °· Pérdida de la coordinación. °· Cambios en el estado de ánimo y la conducta. °· Pensamiento confuso. °· Dificultad para hablar (arrastrar las palabras). °· Devolver la comida (vomitar). °· Confusión. °· Disminución de la frecuencia respiratoria. °· Sacudidas y temblores (convulsiones). °· Pérdida de la conciencia. °CUIDADOS EN EL HOGAR °· No conduzca vehículos después de beber alcohol. °· Beba gran cantidad de líquido para mantener el pis (orina) de tono claro o de color amarillo pálido. Evite la cafeína. °· Sólo tome los medicamentos que le haya indicado su médico. °SOLICITE AYUDA SI: °· Devuelve (vomita) repetidas veces. °· No mejora luego de algunos días. °· Se intoxica con alcohol con frecuencia. El médico podrá ayudarlo a decidir si debe consultar a un [place]apeuta especializado en el abuso de sustancias. °SOLICITE AYUDA DE INMEDIATO SI: °· Siente temblores cuando deja de beber. °· Tiene temblores o sacudidas. °· Vomita sangre. Puede ser de color rojo brillante o similar a la borra del café. °· Nota sangre en las heces (movimiento intestinal). °· Se siente mareado o se desvanece (se desmaya). °ASEGÚRESE DE QUE:  °· Comprende estas instrucciones. °· Controlará su afección. °· Recibirá ayuda de inmediato si no mejora o si empeora. °Document Released: 07/19/2010 Document Revised: 02/16/2013 °ExitCare® Patient Information ©2015 [employer]. This information is not intended to replace advice given to you by your health care provider. Make sure you discuss any questions you have with your  health care provider. ° °

## 2015-01-09 NOTE — ED Notes (Signed)
Pt requesting a turkey sandwich.  

## 2015-01-09 NOTE — ED Notes (Signed)
Patient taken to ct

## 2015-01-29 DEATH — deceased
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
